# Patient Record
Sex: Female | Born: 1960 | Race: Black or African American | Hispanic: No | Marital: Single | Smoking: Current every day smoker
Health system: Southern US, Community
[De-identification: ages and names within clinical notes are randomized; demographics above are authoritative.]

## PROBLEM LIST (undated history)

## (undated) DIAGNOSIS — Z872 Personal history of diseases of the skin and subcutaneous tissue: Secondary | ICD-10-CM

## (undated) DIAGNOSIS — Q519 Congenital malformation of uterus and cervix, unspecified: Secondary | ICD-10-CM

## (undated) DIAGNOSIS — Z8544 Personal history of malignant neoplasm of other female genital organs: Secondary | ICD-10-CM

## (undated) DIAGNOSIS — Z8711 Personal history of peptic ulcer disease: Secondary | ICD-10-CM

## (undated) DIAGNOSIS — R102 Pelvic and perineal pain: Secondary | ICD-10-CM

## (undated) DIAGNOSIS — Z8541 Personal history of malignant neoplasm of cervix uteri: Secondary | ICD-10-CM

## (undated) DIAGNOSIS — J45909 Unspecified asthma, uncomplicated: Secondary | ICD-10-CM

## (undated) DIAGNOSIS — N950 Postmenopausal bleeding: Secondary | ICD-10-CM

---

## 1998-02-03 ENCOUNTER — Emergency Department (HOSPITAL_COMMUNITY): Admission: EM | Admit: 1998-02-03 | Discharge: 1998-02-03 | Payer: Self-pay | Admitting: Emergency Medicine

## 1998-10-08 ENCOUNTER — Emergency Department (HOSPITAL_COMMUNITY): Admission: EM | Admit: 1998-10-08 | Discharge: 1998-10-08 | Payer: Self-pay | Admitting: Emergency Medicine

## 1998-12-02 ENCOUNTER — Emergency Department (HOSPITAL_COMMUNITY): Admission: EM | Admit: 1998-12-02 | Discharge: 1998-12-02 | Payer: Self-pay | Admitting: Emergency Medicine

## 1998-12-02 ENCOUNTER — Encounter: Payer: Self-pay | Admitting: Emergency Medicine

## 1998-12-04 ENCOUNTER — Emergency Department (HOSPITAL_COMMUNITY): Admission: EM | Admit: 1998-12-04 | Discharge: 1998-12-04 | Payer: Self-pay | Admitting: Emergency Medicine

## 2000-04-10 HISTORY — PX: OTHER SURGICAL HISTORY: SHX169

## 2000-06-12 ENCOUNTER — Emergency Department (HOSPITAL_COMMUNITY): Admission: EM | Admit: 2000-06-12 | Discharge: 2000-06-12 | Payer: Self-pay | Admitting: Emergency Medicine

## 2000-06-12 ENCOUNTER — Encounter: Payer: Self-pay | Admitting: Emergency Medicine

## 2000-06-17 ENCOUNTER — Emergency Department (HOSPITAL_COMMUNITY): Admission: EM | Admit: 2000-06-17 | Discharge: 2000-06-17 | Payer: Self-pay | Admitting: Emergency Medicine

## 2001-08-01 ENCOUNTER — Emergency Department (HOSPITAL_COMMUNITY): Admission: EM | Admit: 2001-08-01 | Discharge: 2001-08-01 | Payer: Self-pay | Admitting: Emergency Medicine

## 2001-08-04 ENCOUNTER — Encounter: Payer: Self-pay | Admitting: Emergency Medicine

## 2001-08-04 ENCOUNTER — Emergency Department (HOSPITAL_COMMUNITY): Admission: EM | Admit: 2001-08-04 | Discharge: 2001-08-04 | Payer: Self-pay | Admitting: Emergency Medicine

## 2002-01-21 ENCOUNTER — Inpatient Hospital Stay (HOSPITAL_COMMUNITY): Admission: AD | Admit: 2002-01-21 | Discharge: 2002-01-21 | Payer: Self-pay | Admitting: Obstetrics and Gynecology

## 2002-01-23 ENCOUNTER — Encounter: Admission: RE | Admit: 2002-01-23 | Discharge: 2002-01-23 | Payer: Self-pay | Admitting: *Deleted

## 2002-01-23 ENCOUNTER — Encounter (INDEPENDENT_AMBULATORY_CARE_PROVIDER_SITE_OTHER): Payer: Self-pay | Admitting: *Deleted

## 2002-01-30 ENCOUNTER — Encounter: Admission: RE | Admit: 2002-01-30 | Discharge: 2002-01-30 | Payer: Self-pay | Admitting: *Deleted

## 2002-01-30 ENCOUNTER — Encounter (INDEPENDENT_AMBULATORY_CARE_PROVIDER_SITE_OTHER): Payer: Self-pay | Admitting: *Deleted

## 2002-01-31 ENCOUNTER — Other Ambulatory Visit: Admission: RE | Admit: 2002-01-31 | Discharge: 2002-01-31 | Payer: Self-pay | Admitting: Obstetrics and Gynecology

## 2002-03-20 ENCOUNTER — Encounter: Admission: RE | Admit: 2002-03-20 | Discharge: 2002-03-20 | Payer: Self-pay | Admitting: Obstetrics and Gynecology

## 2002-03-27 ENCOUNTER — Encounter (INDEPENDENT_AMBULATORY_CARE_PROVIDER_SITE_OTHER): Payer: Self-pay

## 2002-03-27 ENCOUNTER — Ambulatory Visit: Admission: RE | Admit: 2002-03-27 | Discharge: 2002-03-27 | Payer: Self-pay | Admitting: Gynecology

## 2002-04-06 ENCOUNTER — Encounter: Payer: Self-pay | Admitting: Gynecology

## 2002-04-10 ENCOUNTER — Encounter (INDEPENDENT_AMBULATORY_CARE_PROVIDER_SITE_OTHER): Payer: Self-pay | Admitting: Specialist

## 2002-04-10 ENCOUNTER — Ambulatory Visit (HOSPITAL_COMMUNITY): Admission: RE | Admit: 2002-04-10 | Discharge: 2002-04-10 | Payer: Self-pay | Admitting: Gynecology

## 2002-04-24 ENCOUNTER — Ambulatory Visit: Admission: RE | Admit: 2002-04-24 | Discharge: 2002-04-24 | Payer: Self-pay | Admitting: Gynecology

## 2002-05-16 ENCOUNTER — Ambulatory Visit: Admission: RE | Admit: 2002-05-16 | Discharge: 2002-05-16 | Payer: Self-pay | Admitting: Gynecology

## 2002-06-20 ENCOUNTER — Ambulatory Visit: Admission: RE | Admit: 2002-06-20 | Discharge: 2002-06-20 | Payer: Self-pay | Admitting: Gynecology

## 2002-06-26 ENCOUNTER — Inpatient Hospital Stay (HOSPITAL_COMMUNITY): Admission: RE | Admit: 2002-06-26 | Discharge: 2002-06-28 | Payer: Self-pay | Admitting: Obstetrics & Gynecology

## 2002-06-26 ENCOUNTER — Encounter (INDEPENDENT_AMBULATORY_CARE_PROVIDER_SITE_OTHER): Payer: Self-pay

## 2002-06-26 HISTORY — PX: OTHER SURGICAL HISTORY: SHX169

## 2002-06-30 ENCOUNTER — Inpatient Hospital Stay (HOSPITAL_COMMUNITY): Admission: AD | Admit: 2002-06-30 | Discharge: 2002-07-03 | Payer: Self-pay | Admitting: Obstetrics and Gynecology

## 2002-08-15 ENCOUNTER — Encounter (INDEPENDENT_AMBULATORY_CARE_PROVIDER_SITE_OTHER): Payer: Self-pay | Admitting: Specialist

## 2002-08-15 ENCOUNTER — Ambulatory Visit: Admission: RE | Admit: 2002-08-15 | Discharge: 2002-08-15 | Payer: Self-pay | Admitting: Gynecology

## 2002-11-27 ENCOUNTER — Emergency Department (HOSPITAL_COMMUNITY): Admission: EM | Admit: 2002-11-27 | Discharge: 2002-11-27 | Payer: Self-pay | Admitting: Emergency Medicine

## 2003-09-01 ENCOUNTER — Inpatient Hospital Stay (HOSPITAL_COMMUNITY): Admission: AD | Admit: 2003-09-01 | Discharge: 2003-09-01 | Payer: Self-pay | Admitting: *Deleted

## 2004-04-16 ENCOUNTER — Emergency Department (HOSPITAL_COMMUNITY): Admission: EM | Admit: 2004-04-16 | Discharge: 2004-04-17 | Payer: Self-pay | Admitting: Emergency Medicine

## 2004-08-18 ENCOUNTER — Emergency Department (HOSPITAL_COMMUNITY): Admission: EM | Admit: 2004-08-18 | Discharge: 2004-08-18 | Payer: Self-pay | Admitting: Emergency Medicine

## 2005-06-13 ENCOUNTER — Emergency Department (HOSPITAL_COMMUNITY): Admission: EM | Admit: 2005-06-13 | Discharge: 2005-06-13 | Payer: Self-pay | Admitting: Emergency Medicine

## 2005-07-15 ENCOUNTER — Encounter: Payer: Self-pay | Admitting: Vascular Surgery

## 2005-07-15 ENCOUNTER — Ambulatory Visit (HOSPITAL_COMMUNITY): Admission: RE | Admit: 2005-07-15 | Discharge: 2005-07-15 | Payer: Self-pay | Admitting: Orthopedic Surgery

## 2005-08-26 ENCOUNTER — Encounter: Admission: RE | Admit: 2005-08-26 | Discharge: 2005-11-24 | Payer: Self-pay | Admitting: Orthopedic Surgery

## 2006-07-19 ENCOUNTER — Emergency Department (HOSPITAL_COMMUNITY): Admission: EM | Admit: 2006-07-19 | Discharge: 2006-07-19 | Payer: Self-pay | Admitting: *Deleted

## 2010-07-03 NOTE — Consult Note (Signed)
NAME:  [name], [name]                          ACCOUNT NO.:  [id number]   MEDICAL RECORD NO.:  [id number]                   PATIENT TYPE:  OUT   LOCATION:  GYN                                  FACILITY:  [facility]   PHYSICIAN:  De Blanch, M.D.         DATE OF BIRTH:  [birth year]   DATE OF CONSULTATION:  03/27/2002  DATE OF DISCHARGE:                                   CONSULTATION   REASON FOR CONSULTATION:  A 50 year old African-American female seen in  consultation at the request of Dr. Elinor Dodge.  The patient initially saw Dr.  Okey Dupre in December 2003 with lesions on her vulva and a painful lesion on the  left vulva.  Representative biopsies were taken after the patient had been  treated with doxycycline.  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  A Pap smear from the cervix showed low-grade squamous  intraepithelial lesion.   The patient reports she is not certain how long she has had lesions on the  vulva.  She apparently has not had significant amount of medical or  gynecologic care and cannot remember when she had her last Pap smear.   PAST MEDICAL HISTORY/MEDICAL ILLNESSES:  Medical illnesses:  Peptic ulcer  disease (resolved).  The patient does take Zantac and Pepcid AC p.r.n.   SURGICAL HISTORY:  None.   DRUG ALLERGIES:  None.   FAMILY HISTORY:  Negative for gynecologic, breast, or colon cancer.   SOCIAL HISTORY:  The patient is unemployed; she smokes.   OBSTETRICAL HISTORY:  Gravida 0.   GYNECOLOGICAL HISTORY:  The patient has regular menstrual periods monthly.   PHYSICAL EXAMINATION:  GENERAL:  Reveals a well-developed black female in no  acute distress.  HEENT:  Negative.  NECK:  Supple without thyromegaly.  NODES:  There is no supraclavicular or inguinal adenopathy.  ABDOMEN:  Soft, nontender.  No masses, organomegaly, or hernias are noted.  PELVIC:  Examination of the vulva shows multiple raised hypopigmented and  hyperpigmented lesions on the vulva and perianal region.  In addition, there  is a deep ulcerative lesion on the left posterior vulva.  Palpation of this  region does not reveal any fixation.   The vagina is without lesions.  Cervix is nulliparous.  Uterus is anterior,  normal shape, size, and consistency.  The rest of the exam is compromised by  the patient's discomfort.   PROCEDURE NOTE:  Culdoscopic examination of the cervix is performed.  The  patient has a large area of dense, white epithelium covering nearly all of  the cervix.  Representative biopsies are obtained.    IMPRESSION:  1. Carcinoma in situ of the vulva.  I am concerned about the ulcerative     lesion and as to whether that might represent an invasive carcinoma.  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  We  will await biopsy reports.   I think it most imperative that we evaluate the ulcerative lesion and  therefore would recommend the patient undergo examination under anesthesia  and additional biopsies or excision of the deep ulcer on the left posterior  labia majora.  Once these reports are back we can then schedule a more  definitive procedure which would likely require a skinning vulvectomy with  split-thickness skin graft as well as treatment of the cervix with either a  conization, a LEEP procedure, or laser vaporization.  We will take the first  step by scheduling the patient to undergo excisional biopsy of the vulvar  ulcer in two weeks.  In the interim we will obtain a CBC with differential,  OPAA metabolic panel, and test for HIV.  The patient is encouraged to  discontinue smoking.                                               De Blanch, M.D.    DC/MEDQ  D:  03/27/2002  T:  03/27/2002  Job:  [id number]   cc:   Michele Mcalpine D. Okey Dupre, M.D.  [address]  [place]  [place] [address]  Fax: [phone]   Telford Nab, R.N.  [address]  [place], [place] [address]  Fax: [phone]

## 2010-07-03 NOTE — Op Note (Signed)
[name], [name]                          ACCOUNT NO.:  [id number]   MEDICAL RECORD NO.:  [id number]                   PATIENT TYPE:  AMB   LOCATION:  DAY                                  FACILITY:  [facility]   PHYSICIAN:  De Blanch, M.D.         DATE OF BIRTH:  [birth year]   DATE OF PROCEDURE:  04/10/2002  DATE OF DISCHARGE:                                 OPERATIVE REPORT   PREOPERATIVE DIAGNOSIS:  Carcinoma in situ of the vulva, left vulvar ulcer,  carcinoma in situ of the cervix with inadequate colposcopy.   POSTOPERATIVE DIAGNOSIS:  Carcinoma in situ of the vulva, left vulvar ulcer,  carcinoma in situ of the cervix with inadequate colposcopy.   OPERATION PERFORMED:  Examination under anesthesia.  Cold knife conization,  endocervical curettage, partial left vulvectomy.   SURGEON:  De Blanch, M.D.   ASSISTANT:  Telford Nab, R.N.   ANESTHESIA:  General with orotracheal tube.   ESTIMATED BLOOD LOSS:  10 ml.   OPERATIVE FINDINGS:  Examination under anesthesia revealed multiple  hyperpigmented, hypopigmented raised white lesions on the vulva.  There were  also lesions on the perianal region as well.  There was an ulcerative lesion  on the left posterior vulva which was ultimately excised.  The cervix was  enlarged to 4 to 5 cm in diameter and with application of acetic acid had an  extensive area of white epithelium.  The cervical os was stenotic.  The  uterus sounded to approximately 8 cm anteriorly.   INDICATIONS FOR PROCEDURE:  The patient was brought to the operating room  and after satisfactory attainment of general anesthesia, was placed in  lithotomy position in candy cane stirrups.  The perineum and vagina were  prepped with Betadine.  The bladder was emptied with a straight catheter and  the patient was draped.  A weighted speculum was placed in the posterior  vagina.  A Deaver elevated the bladder and the cervix was grasped with a  tenaculum.  Two #0 Vicryl sutures were placed at 3 and 9 o'clock in the  cervix to provide hemostasis and control the cervical branches of the  uterine artery.  The cervix was then injected with 1% Xylocaine with  epinephrine.  A circumferential incision was created around the white  epithelium and a cold knife conization was performed.  The endocervix was  sounded with a sound and then endocervical curettage performed.  The cone  bed was cauterized using electrocautery.   Attention was turned to the vulva.  The vulvar ulcer which had previously  been identified was circumscribed.  As part of the incision, it was clear  that we were cutting across previously documented carcinoma in situ and  therefore surgical margins would not be a serious consideration at this  juncture.   A cautery was used to excise the entire lesion and control subcutaneous  bleeding.  The subcutaneous tissue was then reapproximated with  interrupted  sutures of 2-0 Vicryl and the skin reapproximated with interrupted sutures  of 3-0 Vicryl.   The cervix was then inspected.  A clot was on the cervix indicating the  continued oozing from the cone bed.  A running locking suture was then  placed using #1 Vicryl to achieve further hemostasis of the entire cone bed.  At completion of this, the cone bed was reinspected and seemed to be  hemostatic.   The patient was awakened from anesthesia and taken to the recovery room in  satisfactory condition.  Sponge, needle and instrument counts were correct  times two.   DESCRIPTION OF PROCEDURE:                                                De Blanch, M.D.    DC/MEDQ  D:  04/10/2002  T:  04/10/2002  Job:  [id number]   cc:   Telford Nab, R.N.   Javier Glazier. Okey Dupre, M.D.  [address]  [place]  [place] [address]  Fax: [phone]

## 2010-07-03 NOTE — Consult Note (Signed)
[name], [name]                          ACCOUNT NO.:  [id number]   MEDICAL RECORD NO.:  [id number]                   PATIENT TYPE:   LOCATION:                                       FACILITY:  [facility]   PHYSICIAN:  De Blanch, M.D.         DATE OF BIRTH:  07/20/1960   DATE OF CONSULTATION:  06/20/2002  DATE OF DISCHARGE:                                   CONSULTATION   REASON FOR CONSULTATION:  A 50 year old African-American female seen back in  preparation for surgery which is scheduled for next Tuesday.  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   The patient has no symptoms although she has considerable amount of pruritus  on her right vulva.   HISTORY OF PRESENT ILLNESS:  The patient underwent wide local excision of an  ulcerative vulvar lesion on April 10, 2002 as well as undergoing a cold  knife conization.  The cold knife conization shows severe dysplasia with  negative margins.  The left posterior vulva showed extensive squamous cell  carcinoma in situ associated with an invasive well differentiated squamous  cell carcinoma which extended to within 1 mm of the deep margin.  The  patient had a relatively uncomplicated postoperative course.   PAST SURGICAL HISTORY:  Wide local excision of vulva and cold knife  conization.   PAST MEDICAL HISTORY:  Medical Illnesses:  None.   ALLERGIES:  Drug Allergies:  None.   FAMILY HISTORY:  Negative for gynecologic, breasts, or colon cancers.   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   OBSTETRICAL HISTORY:  Gravida 0.   GYNECOLOGIC HISTORY:  The patient has a history of chronic carcinoma in situ  of the vulva which has really been untreated.   REVIEW OF SYSTEMS:   Reveals no cardiovascular, pulmonary, GI, GU,  musculoskeletal, and neurologic symptoms.   PHYSICAL EXAMINATION:  VITAL SIGNS:  Weight 136 pounds, blood pressure  118/70.  GENERAL:  The patient is a healthy, African-American female in no acute  distress.  HEENT:  Negative.  NECK:  Supple without thyromegaly.  LYMPHATICS:  There is no supraclavicular or inguinal adenopathy.  CHEST:  Clear to percussion and auscultation.  ABDOMEN:  Soft, nontender.  No masses, organomegaly, ascites, or hernias are  noted.  PELVIC:  EGBUS shows that the right vulva is approximately 50% replaced by a  white raised hyperpigmented and white epithelium consistent with carcinoma  in situ.  There is no evidence of ulceration.  A similar appearance is noted  in the left vulva.  The area where a wide local excision was performed is  well healed.  The patient also has carcinoma in situ  in hemorrhoidal tags  and around the anus.  The vagina is normal.  The cervix is healing well from  the conization.  Uterus is anterior and normal in shape, size, and  consistency.  There is no adnexal masses noted.   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   PLAN:  I had a lengthy discussion with the patient and her mother regarding  the management of this lesion.  We would recommend she undergo a modified  radical vulvectomy on the left with a simple vulvectomy on the right in  order to encompass all gross carcinoma in situ.  In addition, we will excise  lesions from her anus.  Primary closure will be planned if the patient does  not wish to have skin grafting.  In addition, we will perform an  inguinofemoral lymphadenectomy.   The risks of surgery including hemorrhage, lymphedema, lymphocyst, and wound  breakdown were discussed.  All of the patient's questions are answered.   We also presented to the patient the opportunity to  participate in the  [facility] protocol being sponsored by the Gynecologic/Oncology group.  She  will see the protocol nurse later today and would plan on signing onto the  protocol.  Surgery is scheduled for Tuesday, May 11th with Javier Glazier. Okey Dupre,  M.D. assisting.                                               De Blanch, M.D.    DC/MEDQ  D:  06/20/2002  T:  06/20/2002  Job:  [id number]   cc:   Javier Glazier. Okey Dupre, M.D.  [address]  [place]  [place] [address]  Fax: [phone]   Telford Nab, R.N.

## 2010-07-03 NOTE — Consult Note (Signed)
   [name], [name]                          ACCOUNT NO.:  [id number]   MEDICAL RECORD NO.:  [id number]                   PATIENT TYPE:  OUT   LOCATION:  GYN                                  FACILITY:  [facility]   PHYSICIAN:  De Blanch, M.D.         DATE OF BIRTH:  06/23/1960   DATE OF CONSULTATION:  04/24/2002  DATE OF DISCHARGE:                                   CONSULTATION   REASON FOR CONSULTATION:  A 50 year old black female who returns having  undergone a partial vulvectomy and conization on 04/10/02.  Final pathology  showed the conization had severe dysplasia with negative margins.  The left  posterior vulvar lesion which was ulcerative and painful preoperatively,  revealed invasive vulvar cancer at least 5 mm in depth and 1 mm from the  deep surgical margin.   The patient has had an uncomplicated postoperative course, has returned to  school full-time.   PHYSICAL EXAMINATION:  VITAL SIGNS:  Weight 138 pounds, blood pressure  118/70.  GENERAL:  The patient is a healthy African-American female in no acute  distress.  ABDOMEN:  Soft, nontender.  There is no inguinal adenopathy.  PELVIC:  EGBUS shows extensive carcinoma in situ of the vulva.  The  vulvectomy site, unfortunately, has had some separation, although it is  granulating well.   IMPRESSION:  1. Invasive vulvar cancer on excisional biopsy.  2. Wound separation.  The patient needs to sitz baths b.i.d. and apply     Silvadene cream after each bath.  She is given a prescription for     Silvadene cream today.   I discussed with her the pathology.  We will allow this area to heal, and  then we will have her back in about four weeks to re-examine her and plan  more definitive surgery which will require lymphadenectomy on the left.  She  would also be a candidate for the [facility] GOG protocol.                                                 De Blanch, M.D.    DC/MEDQ  D:  04/24/2002  T:   04/24/2002  Job:  [id number]   cc:   Michele Mcalpine D. Okey Dupre, M.D.  [address]  [place]  [place] [address]  Fax: [phone]   Telford Nab, R.N.

## 2010-07-03 NOTE — Consult Note (Signed)
NAME:  [name], [name]                          ACCOUNT NO.:  [id number]   MEDICAL RECORD NO.:  [id number]                   PATIENT TYPE:  OUT   LOCATION:  GYN                                  FACILITY:  [facility]   PHYSICIAN:  De Blanch, M.D.         DATE OF BIRTH:  1960/06/23   DATE OF CONSULTATION:  05/16/2002  DATE OF DISCHARGE:                                   CONSULTATION   REASON FOR CONSULTATION:  The patient is a 50 year old African-American  female seen in followup of a newly diagnosed vulvar cancer.  At her last  visit she had some separation of her vulvar wound and has been using sitz  baths and Silvadene cream with good results.  She has minimal pain in this  area at the present time.   She comes accompanied by her mother today to discuss management options and  for a check-up.  She denies any vaginal bleeding or any other significant  new gynecologic symptoms.   PAST SURGICAL HISTORY:  Wide local excision of vulvar mass in 2004.   ALLERGIES:  No known drug allergies.   FAMILY HISTORY:  Negative for gynecologic, breast, or colon cancer.   SOCIAL HISTORY:  The patient is unemployed.  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   OBSTETRICAL HISTORY:  Gravida 0.   GYNECOLOGIC HISTORY:  The patient also has a concurrent history of carcinoma  in situ of the vulva and carcinoma in situ of the cervix.  She underwent  cold knife conization with negative margins.   REVIEW OF SYMPTOMS:  Otherwise negative.   PHYSICAL EXAMINATION:  VITAL SIGNS:  Weigh 138 pounds, blood pressure  118/70.  GENERAL:  The patient is a pleasant healthy young woman in no acute  distress.  HEENT:  Negative.  NECK:  Supple without thyromegaly.  There is  no supraclavicular or inguinal adenopathy.  ABDOMEN:  Soft, nontender, no  masses, ascites, organomegaly, or hernias noted.  PELVIC:  EGBUS shows  extensive carcinoma in situ over both labia majora and  minora bilaterally  across the perineum and involving the anal skin tags and hemorrhoids.  In  addition, there is a cavitated lesion in the lower left vulva which has been  previously excised and found to contain an invasive squamous cell carcinoma  which is well differentiated.  Focally, the carcinoma was less then 1 mm  from the deep margin on this wide excision.   PLAN:  Considering management options, we recommend the patient undergo a  left modified radical vulvectomy with excision of effected skin which  contain carcinoma in situ.  On the right and on the perianal area, wide  excision would be recommended as this appears to be carcinoma in situ,  although I think it should be evaluated more carefully histologically on a  full simple vulvectomy specimen.  We will also excise the perianal disease.  I had a lengthy discussion with the patient and her mother describing the  recommended surgery, and the alteration in anatomic contour of the vulva.  In addition, she needs to have a left inguinal lymphadenectomy.  We have  asked her to consider participation in the [facility] protocol, evaluating tissel  and wound closure of the vulvar wound, and she will  consider this.  She would like to defer surgery until her graduation on  06/22/02, which I think is reasonable given the long duration this lesion has  been present.  We will schedule surgery for 06/26/02.  The risks of surgery  and all questions are answered.                                                De Blanch, M.D.    DC/MEDQ  D:  05/16/2002  T:  05/17/2002  Job:  [id number]   cc:   Michele Mcalpine D. Okey Dupre, M.D.  [address]  [place]  [place] [address]  Fax: [phone]   Telford Nab, R.N.

## 2010-07-03 NOTE — Consult Note (Signed)
NAME:  [name], [name]                          ACCOUNT NO.:  [id number]   MEDICAL RECORD NO.:  [id number]                   PATIENT TYPE:  OUT   LOCATION:  CARD                                 FACILITY:  [facility]   PHYSICIAN:  De Blanch, M.D.         DATE OF BIRTH:  26-Aug-1960   DATE OF CONSULTATION:  07/04/2002  DATE OF DISCHARGE:  07/03/2002                                   CONSULTATION   REASON FOR CONSULTATION:  The patient is a 50 year old African-American  female who is seen in consultation as an inpatient, having been admitted  three days ago with cellulitis of her left lower extremity.  She underwent a  modified radical vulvectomy and left inguinal lymphadenectomy on Jun 26, 2002.  Final pathology showed no evidence of metastatic disease in the  inguinal nodes, and no invasive residual disease, although she had extensive  carcinoma in situ.  She has been treated with ampicillin and gentamycin, and  has noticed a decrease in fever and white count.  Because of the lower  extremity edema and pain, deep vein thrombosis was considered, and the  patient has been treated with Lovenox.  Earlier this morning, however, she  had a duplex Doppler study of her deep venous system that showed no evidence  of deep vein thrombosis.   The patient herself reports that she is feeling somewhat better, although  she has a slightly modest amount of pain in her left upper medial thigh.  She denies any nausea or vomiting.  She has had some difficulties with bowel  movements, but using Xylocaine gel, has had a bowel movement yesterday.   It is also noted that her vulvar wound has broken down somewhat.   PHYSICAL EXAMINATION:  GENERAL:  A well-developed young black female in no  acute distress.  HEENT:  Negative.  NECK:  Supple without thyromegaly.  There is no supraclavicular or inguinal  adenopathy.  The left inguinal incision is healing well.  The Canadian drain  remains in place, and  is draining serous material.  There is some edema  along the inguinal incision, but no breakdown.  The staples remain intact.  EXTREMITIES:  The upper portion of the medial left thigh is erythematous,  edematous, and slightly tender.  She has full range of motion.  PELVIC:  EGBUS shows breakdown of the left radical vulvectomy site and  across the perineum.  The granulation tissue in this area appears healthy.  A few sutures are removed without difficulty.   IMPRESSION:  Left lower extremity cellulitis which seems to be responding to  medical management.  There is no evidence of a lymphocyst.  A deep vein  thrombosis has been ruled out.    PLAN:  We will discontinue the patient's anticoagulant therapy.  I believe  she can be discharged later today, and would suggest she be treated as an  outpatient with Levaquin and Flagyl.  She will  return to the GYN oncology  clinic in one week for re-assessment.                                               De Blanch, M.D.    DC/MEDQ  D:  07/10/2002  T:  07/10/2002  Job:  [id number]   cc:   Michele Mcalpine D. Okey Dupre, M.D.  [address]  [place]  [place] [address]  Fax: [phone]   Telford Nab, R.N.

## 2010-07-03 NOTE — Op Note (Signed)
[name], [name]                          ACCOUNT NO.:  [id number]   MEDICAL RECORD NO.:  [id number]                   PATIENT TYPE:  INP   LOCATION:  0445                                 FACILITY:  [facility]   PHYSICIAN:  De Blanch, M.D.         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   PROCEDURE:  Left inguinal lymphadenectomy, left modified radical vulvectomy,  right simple vulvectomy and excision of perianal lesions.   SURGEON:  De Blanch, M.D.   ASSISTANTMichele Mcalpine D. Okey Dupre, M.D. and Telford Nab, R.N.   ANESTHESIA:  General with oral tracheal tube.   ESTIMATED BLOOD LOSS:  50 mL   FINDINGS:  Examination under anesthesia revealed a vulva which was  extensively involved with thickened skin that was hyperpigmented including  areas over the anus, hemorrhoidal skin tags and right and left vulva. The  left vulva had previously undergone a left wide excision which revealed  invasive carcinoma in the left posterior portion of the vulva. This area had  completely healed from prior surgery. Inguinal lymph nodes were slightly  enlarged but on frozen section were negative.   DESCRIPTION OF PROCEDURE:  The patient was brought to the operating room and  after satisfactory attainment of general anesthesia was placed in the  modified lithotomy position in [place] stirrups. The anterior abdominal wall,  inguinal regions, anterior thighs, vulva and vagina were prepped with  Betadine and the patient was draped.   An incision was made inferior and parallel to the left inguinal ligament.  The dissection carried down to Camper's fascia. The subcutaneous flap was  created initially cephalad. Dissection was then carried down to the anterior  rectus fascia. The lymph nodes overlying the lower anterior rectus fascia  were then dissected free from the fascia down to the inguinal ligament. The  patient was turned laterally where the tensor fascia lata fascia was  identified. Medially we identified the adductor longus muscle. With care to  avoid injury to the saphenous vein, the dissection was carried along the  cribriform fascia excising all the lymph nodes in the superficial  compartments. Care was taken to avoid injury to the femoral artery and vein.  Hemostasis was achieved with cautery and the smaller vessels were ligated  with 2-0 Vicryl. A branch of the saphenous vein was clamped and divided and  suture ligated with 3-0 silk. The subcutaneous region was irrigated and the  lymph nodes were sent for frozen section. A #19 Blake drain was placed  through a stab incision in the left lower quadrant. The subcutaneous tissue  was reapproximated with interrupted 2-0 Vicryl sutures  and the skin closed  with skin staples. The drain was placed to suction.   The patient was repositioned into a lithotomy position and the vulva  exposed. A modified left radical vulvectomy was then performed excising most  of the left vulva extending down to the anus. The deep dissection was to the  pelvic fascia and pubis. Medially the excision extended to the hymeneal  ring.   Two large hemorrhoids which contained carcinoma in situ were excised. Suture  ligatures were required to control bleeding from this area.   Attention was turned to the left vulva where a simple vulvectomy was  performed with a 1 cm margin around all obvious carcinoma in situ. A minimal  amount of subcutaneous tissue was excised along with the stem. Hemostasis  again achieved with cautery and suture ligatures of larger vessels such as  pudendal vessels.   The wound was then irrigated and then closed  with subcutaneous sutures of 2-  0 Vicryl. The skin was reapproximated with a running subcuticular suture of  2-0 Vicryl. Care was taken to avoid any further suture into the anus.   An ice pack was placed on the vulva, patient was awakened from anesthesia  and taken to the recovery room in satisfactory condition. Sponge, needle and  instrument counts were correct x2.                                               De Blanch, M.D.    DC/MEDQ  D:  06/26/2002  T:  06/26/2002  Job:  [id number]   cc:   Michele Mcalpine D. Okey Dupre, M.D.  [address]  [place]  [place] [address]  Fax: [phone]   Telford Nab, R.N.

## 2010-07-03 NOTE — Consult Note (Signed)
   NAME:  [name], [name] NO.:  [id number]   MEDICAL RECORD NO.:  [id number]                    PATIENT TYPE:   LOCATION:                                       FACILITY:   PHYSICIAN:  De Blanch, M.D.         DATE OF BIRTH:   DATE OF CONSULTATION:  07/10/2002  DATE OF DISCHARGE:                                   CONSULTATION   This 50 year old African-American female returns for recheck.  She was seen  last week with cellulitis in her left upper thigh and breakdown of the  posterior aspect of her vulvar incision.  She reports she has done much  better.  She denies any fever or chills.  She is currently taking Levaquin  and Flagyl and tolerating this well.  She notes that the pain in her leg and  the swelling in her leg is much improved.  She is using Sitz baths three  times a day and applying Silvadene cream to the open wound and reports that  this is feeling much better as well.  Overall, her functional status is much  improved over the last week.   PHYSICAL EXAMINATION:  ABDOMEN: Soft, nontender.  No mass, organomegaly,  ascites, or hernia is noted.  Her inguinal incision is healing well.  Staples are removed.  Steri-Strips are applied.  There is some edema in this  area.  EXTREMITIES:  Examination of the left thigh reveals considerable reduction  in her edema, and there is minimal erythema.  The calf and ankle are normal.  PELVIC:  EG/BUS shows complete breakdown of the posterior aspect of the  vulvar wound including the perineum.  This is granulating and is looking  healthy.  The anal region is also healing fine.  The anterior vulvar  incisions are all healing without difficulty.   IMPRESSION:  1. Cellulitis of the left lower extremity thigh with resolving infection.     The patient will continue taking Flagyl and Levaquin to complete a 10-day     course.  2. Breakdown of the posterior vulvar wound.  The patient will continue local   wound care, and we will expect it to heal by secondary intention.  She     will return to see [place] in two to three weeks for continuing evaluation or     as needed.                                               De Blanch, M.D.    DC/MEDQ  D:  07/10/2002  T:  07/10/2002  Job:  [id number]   cc:   Michele Mcalpine D. Okey Dupre, M.D.  [address]  [place]  [place] [address]  Fax: [phone]   Telford Nab, R.N.

## 2010-07-03 NOTE — H&P (Signed)
[name], [name]                          ACCOUNT NO.:  [id number]   MEDICAL RECORD NO.:  [id number]                   PATIENT TYPE:  INP   LOCATION:  9307                                 FACILITY:  [facility]   PHYSICIAN:  Phil D. Okey Dupre, M.D.                  DATE OF BIRTH:  [birth year]   DATE OF ADMISSION:  06/30/2002  DATE OF DISCHARGE:                                HISTORY & PHYSICAL   CHIEF COMPLAINT:  The patient came in complaining of severe left thigh pain  as well as pain near a surgical incision scar in the left inguinal area with  numbness in the left thigh and a terrible smell.   HISTORY OF PRESENT ILLNESS:  The patient is a 50 year old black female four  days postoperative at [facility] where she underwent left inguinal  lymphadenectomy with modified radical left vulvectomy and right simple  vulvectomy that extended around the perianal area.  The surgery was done for  squamous cell carcinoma of the vulva.  She was discharged from [facility]  two days ago in good condition, comfortable, and afebrile, and during the  night last night and the day prior to admission she began feeling warm and  general malaise and severe pain in the area of her left inguinal incision  extending down into the left thigh.  She also complained of a very foul odor  coming from her perineum.   MEDICATIONS:  The medications that the patient was on was only Percocet for  pain and Peri-Colace.   ALLERGIES:  There are no known allergies.   PAST MEDICAL HISTORY:  Noncontributory just intermittent interval  hospitalization.   FAMILY HISTORY:  Noncontributory.   PHYSICAL EXAMINATION:  VITAL SIGNS:  On admission, the patient was 5 feet 1  inch, weighed 136 pounds.  Her temperature was 98.4, pulse of 80,  respirations 20 per minute, and a blood pressure 126/82.  Pain description  was a 10.  GENERAL APPEARANCE:  A well-developed female in obvious distress and  tearful, complaining of pain  as stated in the Present Illness.  HEENT:  Normal.  LUNGS:  Clear to auscultation and percussion.  NECK:  Supple with no masses.  HEART:  No murmur.  Normal sinus rhythm.  BREASTS:  Symmetrical with no dominant masses or __________.  ABDOMEN:  Soft, flat, nontender with good active bowel sounds except for the  area in the left inguinal region just below the diagonal incision in that  area where the lymphadenectomy had been carried out.  There was marked  erythema and induration without fluctuation.  This extended down into the  left thigh from three quarters across the anterior aspect from medial to  lateral and extending around the medial portion of the thigh stopping just  approximately 3 cm from going to the posterior aspect of the thigh.  GENITOURINARY:  The area of surgery  in the perineum appeared to be healed  normally with exception of an excoriation with a foul exudate and  significant breakdown of the incision around the anal area.  The  extremities, other than the aforementioned, were normal.  Calves were normal  and no other genitalia exam was done because of the patient's pain.   IMPRESSION:  Cellulitis of the left thigh interior extending to the lower  portion of the surgical incision with a possibility of thrombophlebitis and  a breakdown of the perianal surgical site.   PLAN:  Admission, antibiotic treatment with triple therapy, anticoagulation  with heparin, and irrigation and sitz baths for the perianal area, and pain  control.  __________of other significance is there is still a significant  drainage from the [place] drain area in the area of the inguinal  lymphadenectomy.                                               Phil D. Okey Dupre, M.D.    PDR/MEDQ  D:  06/30/2002  T:  06/30/2002  Job:  [id number]

## 2010-07-03 NOTE — Discharge Summary (Signed)
NAMEJANELLI, [name]                          ACCOUNT NO.:  [id number]   MEDICAL RECORD NO.:  [id number]                   PATIENT TYPE:  INP   LOCATION:  9306                                 FACILITY:  [facility]   PHYSICIAN:  Phil D. Okey Dupre, M.D.                  DATE OF BIRTH:  1960-04-22   DATE OF ADMISSION:  06/30/2002  DATE OF DISCHARGE:  07/03/2002                                 DISCHARGE SUMMARY   HOSPITAL COURSE:  The patient is a 50 year old black female who was seen in  the MAU four days postoperatively after left inguinal lymphadenectomy and  modified radical left vulvectomy and simple right vulvectomy.  She was two  days after discharge from the hospital without complications.  She arrived  having severe left groin pain which radiated into the left thigh accompanied  by numbness in the left thigh and a severe odor.  She was noted to have a  large cellulitis in the lower part of the inguinal incision that extended to  the left thigh and was admitted and treated as a cellulitis with rule out of  possible thrombophlebitis.  She seemed to improve over the next day and the  evening of Jul 02, 2002, not having had a bowel movement since the time of  surgery and being treated with sitz baths and antibiotics, she was given  Peri-Colace.  By the evening of May 17 she was having a very difficult time  because of symptoms that mimicked a rectal impaction.  We examined the  patient, was able to give her two Fleets enemas, and it helped evacuate some  of the rectum.  Following that she seemed to be much better.  She was also  put on Lovenox during her stay as a precaution in case she had  thrombophlebitis.  On the morning of May 18 she was transported to [facility] for Doppler of the left leg to rule out deep vein thrombosis, which  turned out to be negative and on her return was discharged to home to be  followed up the following day with Dr. Stanford Breed.  Over the hospital  course  for which she had been afebrile up until her day of admission, she  finally had a fever of 100.7 on the evening of her second admission here.  Had one other elevation during her stay.  Otherwise, she was afebrile and  she was discharged on Colace, Milk of Magnesia, Flagyl by mouth, and  Levaquin.  She was asked not to strain to avoid pressure on her perineum.  She was given sitz baths four times a day at home.   DISCHARGE DIAGNOSIS:  Postoperative cellulitis of the left thigh.  Phil D. Okey Dupre, M.D.    PDR/MEDQ  D:  08/09/2002  T:  08/11/2002  Job:  [id number]

## 2010-07-03 NOTE — Discharge Summary (Signed)
   [name], [name]                          ACCOUNT NO.:  [id number]   MEDICAL RECORD NO.:  [id number]                   PATIENT TYPE:  INP   LOCATION:  0445                                 FACILITY:  [facility]   PHYSICIAN:  Phil D. Okey Dupre, M.D.                  DATE OF BIRTH:  September 07, 1960   DATE OF ADMISSION:  06/26/2002  DATE OF DISCHARGE:  06/28/2002                                 DISCHARGE SUMMARY   HISTORY OF PRESENT ILLNESS:  The patient is a 50 year old black female with  stage II vulvular cancer who was admitted for definitive surgery.   HOSPITAL COURSE:  Surgery was carried out by Dr. Stanford Breed with the  assistance of Dr. Elinor Dodge and Telford Nab, R.N. on the day of  admission.  The patient underwent inguinal lymphadenectomy with a left  modified radical vulvectomy and a right simple vulvectomy.  This was carried  down around the perianal area.  At the time of discharge, final pathology  was not yet available.  The patient had been afebrile postoperatively, and  was doing very well.  The abdominal wound in the place of left inguinal  lymphadenectomy had a drain which was left in place at the time of  discharge.  This was draining copious amounts of straw colored lymphatic  fluid, and will be left.  The perineal wound was clean and appeared to be in  good condition.  The patient was given instructions as to shower, and use  the Peri-Bottle and a hair dryer to wash off.  She is also encouraged to get  a sponge rubber donut to make sitting more comfortable for her.  She was  given a sitz bath to take home just in case she needed that in the case of a  suture breakdown, this did not seem likely at this present time.  She was  placed on Peri-Colace b.i.d. until she had a bowel movement, and then to go  on Colace b.i.d. and to increase her fluid intake.  She was placed on  Percocet for pain.  She was instructed to the MAU and see Dr. Okey Dupre on July 31, 2002, and will be  followed up on August 03, 2002, at [facility]  to see Dr. Stanford Breed.   CONDITION ON DISCHARGE:  Satisfactory, status post left inguinal  lymphadenectomy and left modified radical vulvectomy with right simple  vulvectomy.                                               Phil D. Okey Dupre, M.D.    PDR/MEDQ  D:  06/29/2002  T:  06/29/2002  Job:  [id number]

## 2010-07-03 NOTE — Consult Note (Signed)
NAME:  [name], [name]                          ACCOUNT NO.:  [id number]   MEDICAL RECORD NO.:  [id number]                   PATIENT TYPE:  OUT   LOCATION:  GYN                                  FACILITY:  [facility]   PHYSICIAN:  De Blanch, M.D.         DATE OF BIRTH:  [birth year]   DATE OF CONSULTATION:  08/15/2002  DATE OF DISCHARGE:                                   CONSULTATION   HISTORY OF PRESENT ILLNESS:  A 50 year old female returns for continuing  follow-up of a stage II vulvar carcinoma and extensive vulvar carcinoma in  situ.  She underwent a modified radical vulvectomy, left inguinal  lymphadenectomy, and partial simple vulvectomy on Jun 26, 2002.  All lymph  nodes were negative and the remainder of the vulvar specimen showed no  evidence of residual invasive carcinoma although she did have extensive  squamous cell carcinoma in situ with focal involvement of margin.  No  further therapy was recommended.  The patient had a relatively uncomplicated  course although she did require readmission for wound cellulitis which  resolved quickly with antibiotics.  The patient today has no symptoms.  She  reports her leg feels fine as does the inguinal region except for some  numbness in her anterior thigh.  She has no new vulvar symptoms or no new  lesions that she is recognizing.  She denies any vaginal bleeding or  discharge.   REVIEW OF SYSTEMS:  Is essentially negative.   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   PHYSICAL EXAMINATION:  VITAL SIGNS:  Weight 134 pounds, blood pressure  112/74.  HEENT:  Negative.  NECK:  Supple without thyromegaly.  NODES:  There is no supraclavicular or inguinal adenopathy.  ABDOMEN:  Soft, nontender.  No mass, organomegaly, ascites, or hernias are  noted.  The inguinal  region specifically has healed well on the left.  No  lymphocysts are noted.  There is no edema and no palpable adenopathy.  PELVIC:  EG/BUS is status post left radical vulvectomy.  The area that had  broken down is completely healed.  She does have approximately 1 cm lesion  in the left anterior labia majora which is very suspicious for carcinoma in  situ.  The remainder of the vulva looks normal.  The vagina also looks  normal.   PROCEDURE NOTE:  After preparation with Betadine the subcutaneous tissue was  infiltrated with 1% Xylocaine.  A wide local excision was then accomplished.  The subcutaneous tissue is reapproximated with interrupted 3-0 chromic  suture and two cutaneous sutures are taken to reapproximate the skin edges.  Hemostasis was excellent.   IMPRESSION:  History of stage II vulvar cancer and extensive carcinoma in  situ, doing quite well although  she appears to have a small local recurrence  which we have widely excised today.  This will be submitted to pathology.  The patient will return to see [place] in three months for continuing follow-up,  or as needed if she has difficulty with her wound or has new symptoms.                                               De Blanch, M.D.    DC/MEDQ  D:  08/15/2002  T:  08/15/2002  Job:  [id number]

## 2013-10-01 ENCOUNTER — Emergency Department (HOSPITAL_COMMUNITY): Payer: Self-pay

## 2013-10-01 ENCOUNTER — Emergency Department (HOSPITAL_COMMUNITY)
Admission: EM | Admit: 2013-10-01 | Discharge: 2013-10-01 | Disposition: A | Discharge status: Home / Self Care | Payer: Self-pay | Attending: Emergency Medicine | Admitting: Emergency Medicine

## 2013-10-01 DIAGNOSIS — M79605 Pain in left leg: Secondary | ICD-10-CM

## 2013-10-01 DIAGNOSIS — S82142A Displaced bicondylar fracture of left tibia, initial encounter for closed fracture: Secondary | ICD-10-CM

## 2013-10-01 DIAGNOSIS — M79609 Pain in unspecified limb: Secondary | ICD-10-CM | POA: Insufficient documentation

## 2013-10-01 DIAGNOSIS — IMO0001 Reserved for inherently not codable concepts without codable children: Secondary | ICD-10-CM | POA: Insufficient documentation

## 2013-10-01 MED ORDER — OXYCODONE-ACETAMINOPHEN 5-325 MG PO TABS: 1.0000 | ORAL_TABLET | Freq: Four times a day (QID) | ORAL | Status: DC | PRN

## 2013-10-01 MED ORDER — MORPHINE SULFATE 4 MG/ML IJ SOLN: 8.0000 mg | Freq: Once | INTRAMUSCULAR | Status: DC

## 2013-10-01 MED ORDER — MORPHINE SULFATE 4 MG/ML IJ SOLN
4.0000 mg | Freq: Once | INTRAMUSCULAR | Status: AC
  Administered 2013-10-01: 4 mg via INTRAVENOUS
  Filled 2013-10-01: qty 1

## 2013-10-01 NOTE — ED Provider Notes (Signed)
CSN: [id number]     Arrival date & time 10/01/13  1040 History   First MD Initiated Contact with Patient 10/01/13 1131     Chief Complaint  Patient presents with  . Leg Pain  . fx tibia      (Consider location/radiation/quality/duration/timing/severity/associated sxs/prior Treatment) HPI Comments: [name] is a 53 y.o. Female with no significant PMHx, who had a fall from a porch last week onto her L knee and caused a tibial plateau fx. Was seen in [place] and told to f/up there with the orthopedist. She presents today with complaints of achy burning pain in her L hip and anterior upper thigh as well as the pain in her L knee which was present at time of injury, as well as intermittent tingling to her L foot. States she is staying with family here and needs ortho f/up here instead. Was supposed to see ortho in [place] tomorrow. States her leg pain is currently 10/10, constant, nonradiating, with no known aggravating or alleviating factors. Takes oxycodone 5mg  at home for pain which helps. States the tingling in her foot is intermittent, and she has not noticed any color changes or swelling. States the knee has become less swollen over the last 6 days. Denies fevers, CP, SOB, abd pain, N/V/D, diaphoresis, loss of mobility in feet, pallor, or rash.   Patient is a 53 y.o. female presenting with leg pain. The history is provided by the patient. No language interpreter was used.  Leg Pain Location:  Leg Time since incident:  6 days Injury: yes   Mechanism of injury: fall   Fall:    Fall occurred: fall off porch.   Height of fall:  Unknown   Point of impact:  Knees   Entrapped after fall: no   Leg location:  L leg (L knee) Pain details:    Quality:  Aching and burning   Radiates to:  Does not radiate   Severity:  Moderate   Onset quality:  Gradual   Duration:  6 days   Timing:  Constant   Progression:  Unchanged Chronicity:  New Dislocation: no   Foreign body present:  No foreign  bodies Tetanus status:  Up to date Prior injury to area:  Yes Relieved by:  Rest, ice and immobilization Worsened by:  Nothing tried Ineffective treatments:  None tried Associated symptoms: decreased ROM, stiffness, swelling (improved) and tingling (intermittent)   Associated symptoms: no back pain, no fever, no itching, no muscle weakness, no neck pain and no numbness     No past medical history on file. No past surgical history on file. No family history on file. History  Substance Use Topics  . Smoking status: Not on file  . Smokeless tobacco: Not on file  . Alcohol Use: Not on file   OB History   No data available     Review of Systems  Constitutional: Negative for fever.  Respiratory: Negative for shortness of breath.   Cardiovascular: Negative for chest pain and leg swelling.  Gastrointestinal: Negative for nausea, vomiting and abdominal pain.  Musculoskeletal: Positive for arthralgias (L knee), gait problem (nonwt bearing), joint swelling (L knee), myalgias (L thigh) and stiffness. Negative for back pain, neck pain and neck stiffness.  Skin: Negative for color change and itching.  Neurological: Negative for dizziness, weakness, numbness and headaches.  10 Systems reviewed and are negative for acute change except as noted in the HPI.     Allergies  Review of patient's allergies indicates  no known allergies.  Home Medications   Prior to Admission medications   Medication Sig Start Date End Date Taking? Authorizing Provider  albuterol (PROVENTIL HFA;VENTOLIN HFA) 108 (90 BASE) MCG/ACT inhaler Inhale 2 puffs into the lungs every 6 (six) hours as needed for wheezing or shortness of breath.   Yes Historical Provider, MD  oxyCODONE (OXY IR/ROXICODONE) 5 MG immediate release tablet Take 5 mg by mouth every 4 (four) hours as needed for severe pain.   Yes Historical Provider, MD   BP 130/82  Pulse 76  Temp(Src) 99.1 F (37.3 C) (Oral)  Resp 12  Wt 165 lb (74.844 kg)   SpO2 100% Physical Exam  Nursing note and vitals reviewed. Constitutional: She is oriented to person, place, and time. Vital signs are normal. She appears well-developed and well-nourished. No distress.  VSS, NAD  HENT:  Head: Normocephalic and atraumatic.  Mouth/Throat: Mucous membranes are normal.  Eyes: Conjunctivae and EOM are normal. Right eye exhibits no discharge. Left eye exhibits no discharge.  Neck: Normal range of motion. Neck supple.  Cardiovascular: Normal rate and intact distal pulses.   Pulses:      Popliteal pulses are 2+ on the right side. Left popliteal pulse not accessible.       Dorsalis pedis pulses are 2+ on the right side, and 2+ on the left side.       Posterior tibial pulses are 2+ on the right side, and 2+ on the left side.  DP/PT pulses palpated and confirmed with doppler, marked  Pulmonary/Chest: Effort normal. No respiratory distress.  Abdominal: Normal appearance. She exhibits no distension.  Musculoskeletal:       Left hip: She exhibits decreased range of motion and tenderness. She exhibits no swelling, no crepitus and no deformity.       Left knee: She exhibits decreased range of motion, swelling, effusion and bony tenderness. She exhibits no deformity and no erythema.  L knee with immobilizer placed, dec ROM and swelling with knee effusion, lateral tibia bony TTP, no erythema or warmth. L ankle nonTTP with FROM intact, wiggles toes well, sensation grossly intact over all extremities, strength with dorsiflexion/plantarflexion 5/5. Pulses intact bilaterally. No skin color changes, b/l extremities warm to touch. L hip mildly TTP along greater trochanter and into quads muscles.  Neurological: She is alert and oriented to person, place, and time. She has normal strength. No sensory deficit.  Sensation grossly intact in all extremities. Strength with dorsiflexion/plantarflexion 5/5 bilaterally  Skin: Skin is warm, dry and intact. No rash noted. No erythema. No  pallor.  Warm, dry, intact, no erythema or color changes  Psychiatric: She has a normal mood and affect.    ED Course  Procedures (including critical care time) Labs Review Labs Reviewed - No data to display  Imaging Review Dg Knee Complete 4 Views Left  10/01/2013   CLINICAL DATA:  Pain post trauma  EXAM: LEFT KNEE - COMPLETE 4+ VIEW  COMPARISON:  None.  FINDINGS: Frontal, lateral, and bilateral oblique views were obtained. There is a comminuted tibial plateau fracture without significant depression. There is a joint effusion. No dislocation. Joint spaces appear intact.  IMPRESSION: Lateral tibial plateau fracture.  Joint effusion.   Electronically Signed   By: Lowella Grip M.D.   On: 10/01/2013 12:24     EKG Interpretation None      MDM   Final diagnoses:  Tibial plateau fracture, left, closed, initial encounter  Leg pain, anterior, left    53y/o female  with tibial plateau fx, nondisplaced. Needs to f/up with ortho, given number for [facility] on call. Discussed continued use of immobilizer, ice and elevation, and home pain meds. Can use additional ibuprofen or tylenol. Discussed crutches and nonweight bearing. Pulses equal bilaterally, no skin changes, no concern for damage to neurvascular function to LLE. I explained the diagnosis and have given explicit precautions to return to the ER including for any other new or worsening symptoms. The patient understands and accepts the medical plan as it's been dictated and I have answered their questions. Discharge instructions concerning home care and prescriptions have been given. The patient is STABLE and is discharged to home in good condition.  BP 111/86  Pulse 88  Temp(Src) 99.1 F (37.3 C) (Oral)  Resp 19  Wt 165 lb (74.844 kg)  SpO2 100%  Meds ordered this encounter  Medications  . morphine 4 MG/ML injection 4 mg    Sig:        Patty Sermons Camprubi-Soms, PA-C 10/01/13 1311

## 2013-10-01 NOTE — ED Notes (Signed)
To ED via private vehicle with c/o left leg pain, from tibial plateau fx last Tuesday-- was seen at [facility] because pt lives in [place]-- is staying in [place] with family at present. States left foot feels numb and having severe pain in leg. Requesting referal to orthopedist in [place] also.

## 2013-10-01 NOTE — ED Provider Notes (Signed)
Medical screening examination/treatment/procedure(s) were performed by non-physician practitioner and as supervising physician I was immediately available for consultation/collaboration.   EKG Interpretation None      Rolland Porter, MD, Abram Sander   Janice Norrie, MD 10/01/13 [phone]

## 2013-10-01 NOTE — ED Notes (Signed)
ASSISTED PATIENT FROM WHEELCHAIR TO BED. PATIENT C/O LEFT KNEE PAIN ,SWEELING. PLACED PILLOW UNDER PATIENTS LEG ,AND APPLIED  ICE PACK FOR PAIN AND SWELLING.

## 2013-10-01 NOTE — Discharge Instructions (Signed)
Wear knee immobilizer until you're see by the orthopedist. Use crutches for all weight bearing, do not use your left leg to walk. Ice and elevate knee throughout the day. Alternate between ibuprofen and your home pain medication for pain relief. May use additional tylenol if needed. Do not drive or operate machinery with pain medication use. Call orthopedic follow up today to schedule followup appointment for this week. Return to the ER for any changes or worsening symptoms.   Knee Fracture, Adult A knee fracture is a break in any of the bones of the lower part of the thigh bone, the upper part of the bones of the lower leg, or of the kneecap. When the bones no longer meet the way they are supposed to it is called a dislocation. Sometimes there can be a dislocation along with fractures. SYMPTOMS  Symptoms may include pain, swelling, inability to bend the knee, deformity of the knee, and inability to walk.  DIAGNOSIS  This problem is usually diagnosed with x-rays. Special studies are sometimes done if a fracture is suspected but cannot be seen on ordinary x-rays. If vessels around the knee are injured, special tests may be done to see what the damage is. TREATMENT   The leg is usually splinted for the first couple of days to allow for swelling. After the swelling is down a cast is put on. Sometimes a cast is put on right away with the sides of the cast cut to allow the knee to swell. If the bones are in place, this may be all that is needed.  If the bones are out of place, medications for pain are given to allow them to be put back in place. If they are seriously out of place, surgery may be needed to hold the pieces or breaks in place using wires, pins, screws or metal plates.  Generally most fractures will heal in 4 to 6 weeks. HOME CARE INSTRUCTIONS   Use your crutches as directed.  To lessen the swelling, keep the injured leg elevated while sitting or lying down.  Apply ice to the injury for  15-20 minutes, 03-04 times per day while awake for 2 days. Put the ice in a plastic bag and place a thin towel between the bag of ice and your cast.  If you have a plaster or fiberglass cast:  Do not try to scratch the skin under the cast using sharp or pointed objects.  Check the skin around the cast every day. You may put lotion on any red or sore areas.  Keep your cast dry and clean.  If you have a plaster splint:  Wear the splint as directed.  You may loosen the elastic around the splint if your toes become numb, tingle, or turn cold or blue.  Do not put pressure on any part of your cast or splint; it may break. Rest your cast only on a pillow the first 24 hours until it is fully hardened.  Your cast or splint can be protected during bathing with a plastic bag. Do not lower the cast or splint into water.  Only take over-the-counter or prescription medicines for pain, discomfort, or fever as directed by your caregiver.  See your caregiver soon if your cast gets damaged or breaks.  It is very important to keep all follow up appointments. Not following up as directed may result in a worsening of your condition or a failure of the fracture to heal properly. SEEK IMMEDIATE MEDICAL CARE IF:  You have continued severe pain.  You have more swelling than you did before the cast was put on.  The area below the fracture becomes painful.  Your skin or toenails below the injury turn blue or gray, or feel cold or numb.  There is drainage coming from under the cast.  New, unexplained symptoms develop (drugs used in treatment may produce side effects). MAKE SURE YOU:   Understand these instructions.  Will watch your condition.  Will get help right away if you are not doing well or get worse. Document Released: 12/15/2005 Document Revised: 04/26/2011 Document Reviewed: 01/16/2007 [facility] Patient Information 2015 [place], [place]. This information is not intended to replace advice  given to you by your health care provider. Make sure you discuss any questions you have with your health care provider.  Cryotherapy Cryotherapy is when you put ice on your injury. Ice helps lessen pain and puffiness (swelling) after an injury. Ice works the best when you start using it in the first 24 to 48 hours after an injury. HOME CARE  Put a dry or damp towel between the ice pack and your skin.  You may press gently on the ice pack.  Leave the ice on for no more than 10 to 20 minutes at a time.  Check your skin after 5 minutes to make sure your skin is okay.  Rest at least 20 minutes between ice pack uses.  Stop using ice when your skin loses feeling (numbness).  Do not use ice on someone who cannot tell you when it hurts. This includes small children and people with memory problems (dementia). GET HELP RIGHT AWAY IF:  You have white spots on your skin.  Your skin turns blue or pale.  Your skin feels waxy or hard.  Your puffiness gets worse. MAKE SURE YOU:   Understand these instructions.  Will watch your condition.  Will get help right away if you are not doing well or get worse. Document Released: 07/21/2007 Document Revised: 04/26/2011 Document Reviewed: 09/24/2010 [facility] Patient Information 2015 [place], [place]. This information is not intended to replace advice given to you by your health care provider. Make sure you discuss any questions you have with your health care provider.  Cast or Splint Care Casts and splints support injured limbs and keep bones from moving while they heal.  HOME CARE  Keep the cast or splint uncovered during the drying period.  A plaster cast can take 24 to 48 hours to dry.  A fiberglass cast will dry in less than 1 hour.  Do not rest the cast on anything harder than a pillow for 24 hours.  Do not put weight on your injured limb. Do not put pressure on the cast. Wait for your doctor's approval.  Keep the cast or splint  dry.  Cover the cast or splint with a plastic bag during baths or wet weather.  If you have a cast over your chest and belly (trunk), take sponge baths until the cast is taken off.  If your cast gets wet, dry it with a towel or blow dryer. Use the cool setting on the blow dryer.  Keep your cast or splint clean. Wash a dirty cast with a damp cloth.  Do not put any objects under your cast or splint.  Do not scratch the skin under the cast with an object. If itching is a problem, use a blow dryer on a cool setting over the itchy area.  Do not trim or  cut your cast.  Do not take out the padding from inside your cast.  Exercise your joints near the cast as told by your doctor.  Raise (elevate) your injured limb on 1 or 2 pillows for the first 1 to 3 days. GET HELP IF:  Your cast or splint cracks.  Your cast or splint is too tight or too loose.  You itch badly under the cast.  Your cast gets wet or has a soft spot.  You have a bad smell coming from the cast.  You get an object stuck under the cast.  Your skin around the cast becomes red or sore.  You have new or more pain after the cast is put on. GET HELP RIGHT AWAY IF:  You have fluid leaking through the cast.  You cannot move your fingers or toes.  Your fingers or toes turn blue or white or are cool, painful, or puffy (swollen).  You have tingling or lose feeling (numbness) around the injured area.  You have bad pain or pressure under the cast.  You have trouble breathing or have shortness of breath.  You have chest pain. Document Released: 06/03/2010 Document Revised: 10/04/2012 Document Reviewed: 08/10/2012 [facility] Patient Information 2015 [place], [place]. This information is not intended to replace advice given to you by your health care provider. Make sure you discuss any questions you have with your health care provider.

## 2014-02-19 ENCOUNTER — Emergency Department (HOSPITAL_COMMUNITY)
Admission: EM | Admit: 2014-02-19 | Discharge: 2014-02-19 | Disposition: A | Discharge status: Home / Self Care | Payer: Self-pay | Attending: Emergency Medicine | Admitting: Emergency Medicine

## 2014-02-19 ENCOUNTER — Encounter (HOSPITAL_COMMUNITY): Payer: Self-pay | Admitting: Emergency Medicine

## 2014-02-19 ENCOUNTER — Emergency Department (HOSPITAL_COMMUNITY): Payer: Self-pay

## 2014-02-19 DIAGNOSIS — R05 Cough: Secondary | ICD-10-CM

## 2014-02-19 DIAGNOSIS — R059 Cough, unspecified: Secondary | ICD-10-CM

## 2014-02-19 DIAGNOSIS — J45909 Unspecified asthma, uncomplicated: Secondary | ICD-10-CM | POA: Insufficient documentation

## 2014-02-19 DIAGNOSIS — Z720 Tobacco use: Secondary | ICD-10-CM | POA: Insufficient documentation

## 2014-02-19 DIAGNOSIS — R112 Nausea with vomiting, unspecified: Secondary | ICD-10-CM | POA: Insufficient documentation

## 2014-02-19 DIAGNOSIS — Z79899 Other long term (current) drug therapy: Secondary | ICD-10-CM | POA: Insufficient documentation

## 2014-02-19 DIAGNOSIS — R197 Diarrhea, unspecified: Secondary | ICD-10-CM | POA: Insufficient documentation

## 2014-02-19 HISTORY — DX: Unspecified asthma, uncomplicated: J45.909

## 2014-02-19 LAB — URINALYSIS, ROUTINE W REFLEX MICROSCOPIC
Bilirubin Urine: NEGATIVE
GLUCOSE, UA: NEGATIVE mg/dL
Hgb urine dipstick: NEGATIVE
KETONES UR: NEGATIVE mg/dL
LEUKOCYTES UA: NEGATIVE
NITRITE: NEGATIVE
PROTEIN: NEGATIVE mg/dL
Specific Gravity, Urine: 1.02 (ref 1.005–1.030)
Urobilinogen, UA: 0.2 mg/dL (ref 0.0–1.0)
pH: 5 (ref 5.0–8.0)

## 2014-02-19 LAB — CBC WITH DIFFERENTIAL/PLATELET
BASOS PCT: 0 % (ref 0–1)
Basophils Absolute: 0 10*3/uL (ref 0.0–0.1)
EOS ABS: 0.1 10*3/uL (ref 0.0–0.7)
EOS PCT: 2 % (ref 0–5)
HCT: 44 % (ref 36.0–46.0)
Hemoglobin: 14.4 g/dL (ref 12.0–15.0)
Lymphocytes Relative: 39 % (ref 12–46)
Lymphs Abs: 2.4 10*3/uL (ref 0.7–4.0)
MCH: 31 pg (ref 26.0–34.0)
MCHC: 32.7 g/dL (ref 30.0–36.0)
MCV: 94.6 fL (ref 78.0–100.0)
MONO ABS: 0.7 10*3/uL (ref 0.1–1.0)
MONOS PCT: 10 % (ref 3–12)
Neutro Abs: 3.1 10*3/uL (ref 1.7–7.7)
Neutrophils Relative %: 49 % (ref 43–77)
PLATELETS: 419 10*3/uL — AB (ref 150–400)
RBC: 4.65 MIL/uL (ref 3.87–5.11)
RDW: 13.6 % (ref 11.5–15.5)
WBC: 6.2 10*3/uL (ref 4.0–10.5)

## 2014-02-19 LAB — COMPREHENSIVE METABOLIC PANEL
ALK PHOS: 105 U/L (ref 39–117)
ALT: 20 U/L (ref 0–35)
AST: 21 U/L (ref 0–37)
Albumin: 3.6 g/dL (ref 3.5–5.2)
Anion gap: 7 (ref 5–15)
BUN: <5 mg/dL — ABNORMAL LOW (ref 6–23)
CALCIUM: 9.6 mg/dL (ref 8.4–10.5)
CO2: 27 mmol/L (ref 19–32)
CREATININE: 0.62 mg/dL (ref 0.50–1.10)
Chloride: 106 mEq/L (ref 96–112)
GFR calc non Af Amer: >90 mL/min (ref >90)
GLUCOSE: 109 mg/dL — AB (ref 70–99)
Potassium: 4 mmol/L (ref 3.5–5.1)
Sodium: 140 mmol/L (ref 135–145)
TOTAL PROTEIN: 7.9 g/dL (ref 6.0–8.3)
Total Bilirubin: 0.3 mg/dL (ref 0.3–1.2)

## 2014-02-19 MED ORDER — ONDANSETRON HCL 4 MG/2ML IJ SOLN
4.0000 mg | Freq: Once | INTRAMUSCULAR | Status: AC
  Administered 2014-02-19: 4 mg via INTRAVENOUS
  Filled 2014-02-19: qty 2

## 2014-02-19 MED ORDER — ONDANSETRON 4 MG PO TBDP: 4.0000 mg | ORAL_TABLET | Freq: Three times a day (TID) | ORAL | Status: DC | PRN

## 2014-02-19 MED ORDER — SODIUM CHLORIDE 0.9 % IV BOLUS (SEPSIS)
1000.0000 mL | Freq: Once | INTRAVENOUS | Status: AC
  Administered 2014-02-19: 1000 mL via INTRAVENOUS

## 2014-02-19 MED ORDER — HYDROCOD POLST-CHLORPHEN POLST 10-8 MG/5ML PO LQCR: 5.0000 mL | Freq: Two times a day (BID) | ORAL | Status: DC | PRN

## 2014-02-19 MED ORDER — HYDROCOD POLST-CHLORPHEN POLST 10-8 MG/5ML PO LQCR
5.0000 mL | Freq: Once | ORAL | Status: AC
  Administered 2014-02-19: 5 mL via ORAL
  Filled 2014-02-19: qty 5

## 2014-02-19 NOTE — ED Provider Notes (Signed)
CSN: [id number]     Arrival date & time 02/19/14  1459 History   First MD Initiated Contact with Patient 02/19/14 1817     Chief Complaint  Patient presents with  . Cough  . Diarrhea     (Consider location/radiation/quality/duration/timing/severity/associated sxs/prior Treatment) The history is provided by the patient and medical records.    This is a 54 year old female with past medical history significant for asthma, presenting to the ED for productive cough, nasal congestion, and sinus pressure for the past week.  States yesterday she developed nausea, vomiting, and diarrhea-- diarrhea has since resolved.  She has had a few episodes of post-tussive emesis today.  States appetite decreased so poor PO intake recently.  Has tried to drink fluids.  States poor sleep because she wakes up often coughing.  No chest pain, SOB, palpitations, dizziness, weakness.  Patient does admit that her mother has recently been sick with similar symptoms.  Vital signs stable on arrival.  Past Medical History  Diagnosis Date  . Asthma    History reviewed. No pertinent past surgical history. No family history on file. History  Substance Use Topics  . Smoking status: Current Every Day Smoker  . Smokeless tobacco: Not on file  . Alcohol Use: Yes   OB History    No data available     Review of Systems  Respiratory: Positive for cough.   Gastrointestinal: Positive for nausea, vomiting and diarrhea.  All other systems reviewed and are negative.     Allergies  Review of patient's allergies indicates no known allergies.  Home Medications   Prior to Admission medications   Medication Sig Start Date End Date Taking? Authorizing Provider  albuterol (PROVENTIL HFA;VENTOLIN HFA) 108 (90 BASE) MCG/ACT inhaler Inhale 2 puffs into the lungs every 6 (six) hours as needed for wheezing or shortness of breath.    Historical Provider, MD  oxyCODONE (OXY IR/ROXICODONE) 5 MG immediate release tablet Take 5 mg by  mouth every 4 (four) hours as needed for severe pain.    Historical Provider, MD  oxyCODONE-acetaminophen (PERCOCET) 5-325 MG per tablet Take 1-2 tablets by mouth every 6 (six) hours as needed for severe pain. 10/01/13   Mercedes Strupp Camprubi-Soms, PA-C   BP 136/80 mmHg  Pulse 71  Temp(Src) 98.9 F (37.2 C) (Oral)  Resp 18  Ht 5\' 1"  (1.549 m)  Wt 145 lb (65.772 kg)  BMI 27.41 kg/m2  SpO2 100%   Physical Exam  Constitutional: She is oriented to person, place, and time. She appears well-developed and well-nourished. No distress.  HENT:  Head: Normocephalic and atraumatic.  Mouth/Throat: Oropharynx is clear and moist.  Eyes: Conjunctivae and EOM are normal. Pupils are equal, round, and reactive to light.  Neck: Normal range of motion. Neck supple.  Cardiovascular: Normal rate, regular rhythm and normal heart sounds.   Pulmonary/Chest: Effort normal and breath sounds normal. No respiratory distress. She has no wheezes.  Lungs clear bilaterally, dry cough noted  Abdominal: Soft. Bowel sounds are normal. There is no tenderness. There is no guarding.  Musculoskeletal: Normal range of motion.  Neurological: She is alert and oriented to person, place, and time.  Skin: Skin is warm and dry. She is not diaphoretic.  Psychiatric: She has a normal mood and affect.  Nursing note and vitals reviewed.   ED Course  Procedures (including critical care time) Labs Review Labs Reviewed  CBC WITH DIFFERENTIAL - Abnormal; Notable for the following:    Platelets 419 (*)  All other components within normal limits  COMPREHENSIVE METABOLIC PANEL - Abnormal; Notable for the following:    Glucose, Bld 109 (*)    BUN <5 (*)    All other components within normal limits  URINALYSIS, ROUTINE W REFLEX MICROSCOPIC    Imaging Review Dg Chest 2 View  02/19/2014   CLINICAL DATA:  Persistent cough, right side chest pain  EXAM: CHEST  2 VIEW  COMPARISON:  None.  FINDINGS: Cardiomediastinal silhouette is  unremarkable. No acute infiltrate or pleural effusion. No pulmonary edema. Mild degenerative changes mid thoracic spine.  IMPRESSION: No active cardiopulmonary disease.   Electronically Signed   By: Lahoma Crocker M.D.   On: 02/19/2014 15:56     EKG Interpretation None      MDM   Final diagnoses:  Diarrhea  Nausea and vomiting, vomiting of unspecified type   54 year old female with URI type symptoms for the past week, N/V/D developing yesterday.  States diarrhea has resolved, she has had a few episodes of posttussis emesis today. On exam, patient afebrile and nontoxic in appearance. Her lung sounds are clear bilaterally. She does have a noted drop cough. Abdominal exam is benign. Lab work is reassuring. Chest x-ray is clear. Patient was given IV fluids, Zofran, and test next with significant improvement of her symptoms. She has been able to tolerate PO fluids in the ED without difficulty. Repeat exam remains overall benign and noninfectious. Constellation of symptoms likely viral in nature. Encouraged supportive care, test next and Zofran for home.  Discussed plan with patient, he/she acknowledged understanding and agreed with plan of care.  Return precautions given for new or worsening symptoms.  Larene Pickett, PA-C 02/19/14 2153  Carmin Muskrat, MD 02/20/14 [phone]

## 2014-02-19 NOTE — ED Notes (Signed)
Pt reports productive cough, nvd x 1 week. Pt has been taking home albuterol and Mucinex without relief. C/o headache and back pain as well.

## 2014-02-19 NOTE — Discharge Instructions (Signed)
Take the prescribed medication as directed.  Continue drinking fluids to keep yourself hydrated. Return to the ED for new or worsening symptoms.

## 2014-02-19 NOTE — ED Notes (Signed)
Pt able to tolerate fluids without difficulty.

## 2014-04-29 ENCOUNTER — Encounter (HOSPITAL_COMMUNITY): Payer: Self-pay | Admitting: Emergency Medicine

## 2014-04-29 ENCOUNTER — Emergency Department (INDEPENDENT_AMBULATORY_CARE_PROVIDER_SITE_OTHER)
Admission: EM | Admit: 2014-04-29 | Discharge: 2014-04-29 | Disposition: A | Discharge status: Home / Self Care | Payer: Self-pay | Source: Home / Self Care | Attending: Family Medicine | Admitting: Family Medicine

## 2014-04-29 DIAGNOSIS — K088 Other specified disorders of teeth and supporting structures: Secondary | ICD-10-CM

## 2014-04-29 DIAGNOSIS — K047 Periapical abscess without sinus: Secondary | ICD-10-CM

## 2014-04-29 DIAGNOSIS — K0889 Other specified disorders of teeth and supporting structures: Secondary | ICD-10-CM

## 2014-04-29 MED ORDER — DICLOFENAC SODIUM 75 MG PO TBEC: 75.0000 mg | DELAYED_RELEASE_TABLET | Freq: Two times a day (BID) | ORAL | Status: DC | PRN

## 2014-04-29 MED ORDER — CLINDAMYCIN HCL 300 MG PO CAPS: 300.0000 mg | ORAL_CAPSULE | Freq: Three times a day (TID) | ORAL | Status: DC

## 2014-04-29 NOTE — ED Notes (Signed)
Call from [facility], patient wants something less expensive . Dr Georgina Snell sp[oke directly w pharmacy to arrange less expensive formulation

## 2014-04-29 NOTE — ED Notes (Signed)
Pt has been suffering from a toothache for about one week, right upper jaw.

## 2014-04-29 NOTE — Discharge Instructions (Signed)
Thank you for coming in today.  [place]:  [place] [phone] (ext [phone])  [place]  Please call Dr. Donn Pierini office [phone] or cell [phone] [address] [place] for tooth removal $200 includes exam, Xray, and extraction and follow up visit.  Bring list of current medications with you.   [facility] [place]  [address] [facility]  [place], [place], [place], [address]  [phone], Ext. [phone]  2nd and 4th Thursday of the month at 6:30am (Simple extractions only - no wisdom teeth or surgery) First come/First serve -First 10 clients served  [facility] [place], [place] and [place] residents only)  [address] [place], [place], [address]  [phone]  [place]  [phone]  [facility]  [phone]  Please call [facility] at [phone] to get the details to get your tooth pulled.    Dental Abscess A dental abscess is a collection of infected fluid (pus) from a bacterial infection in the inner part of the tooth (pulp). It usually occurs at the end of the tooth's root.  CAUSES   Severe tooth decay.  Trauma to the tooth that allows bacteria to enter into the pulp, such as a broken or chipped tooth. SYMPTOMS   Severe pain in and around the infected tooth.  Swelling and redness around the abscessed tooth or in the mouth or face.  Tenderness.  Pus drainage.  Bad breath.  Bitter taste in the mouth.  Difficulty swallowing.  Difficulty opening the mouth.  Nausea.  Vomiting.  Chills.  Swollen neck glands. DIAGNOSIS   A medical and dental history will be taken.  An examination will be performed by tapping on the abscessed tooth.  X-rays may be taken of the  tooth to identify the abscess. TREATMENT The goal of treatment is to eliminate the infection. You may be prescribed antibiotic medicine to stop the infection from spreading. A root canal may be performed to save the tooth. If the tooth cannot be saved, it may be pulled (extracted) and the abscess may be drained.  HOME CARE INSTRUCTIONS  Only take over-the-counter or prescription medicines for pain, fever, or discomfort as directed by your caregiver.  Rinse your mouth (gargle) often with salt water ( tsp salt in 8 oz [250 ml] of warm water) to relieve pain or swelling.  Do not drive after taking pain medicine (narcotics).  Do not apply heat to the outside of your face.  Return to your dentist for further treatment as directed. SEEK MEDICAL CARE IF:  Your pain is not helped by medicine.  Your pain is getting worse instead of better. SEEK IMMEDIATE MEDICAL CARE IF:  You have a fever or persistent symptoms for more than 2-3 days.  You have a fever and your symptoms suddenly get worse.  You have chills or a very bad headache.  You have problems breathing or swallowing.  You have trouble opening your mouth.  You have swelling in the neck or around the eye. Document Released: 02/01/2005 Document Revised: 10/27/2011 Document Reviewed: 05/12/2010 [facility] Patient Information 2015 [place], [place]. This information is not intended to replace advice given to you by your health care provider. Make sure you discuss any questions you have with your health care provider.

## 2014-04-29 NOTE — ED Provider Notes (Signed)
[name] is a 54 y.o. female who presents to Urgent Care today for Right upper tooth pain and swelling present for the last few days worsening today with facial swelling. No fevers or chills vomiting or diarrhea. Patient has tried multiple over-the-counter medications which have not helped much. She has not attempted to contact a dentist yet. No chest pains or palpitations.   Past Medical History  Diagnosis Date  . Asthma    No past surgical history on file. History  Substance Use Topics  . Smoking status: Current Every Day Smoker  . Smokeless tobacco: Not on file  . Alcohol Use: Yes   ROS as above Medications: No current facility-administered medications for this encounter.   Current Outpatient Prescriptions  Medication Sig Dispense Refill  . albuterol (PROVENTIL HFA;VENTOLIN HFA) 108 (90 BASE) MCG/ACT inhaler Inhale 2 puffs into the lungs every 6 (six) hours as needed for wheezing or shortness of breath.    . clindamycin (CLEOCIN) 300 MG capsule Take 1 capsule (300 mg total) by mouth 3 (three) times daily. 30 capsule 0  . diclofenac (VOLTAREN) 75 MG EC tablet Take 1 tablet (75 mg total) by mouth 2 (two) times daily as needed. 30 tablet 0   No Known Allergies   Exam:  BP 134/96 mmHg  Pulse 65  Temp(Src) 98.6 F (37 C) (Oral)  Resp 16  SpO2 99% Gen: Well NAD HEENT: EOMI,  MMM multiple oral caries. Eroded tooth at the right upper premolar tender to touch. Right facial area mildly tender to touch. Normal posterior pharynx and nasal turbinates. Lungs: Normal work of breathing. CTABL Heart: RRR no MRG Abd: NABS, Soft. Nondistended, Nontender Exts: Brisk capillary refill, warm and well perfused.   No results found for this or any previous visit (from the past 24 hour(s)). No results found.  Assessment and Plan: 54 y.o. female with dental pain and infection. Treat with clindamycin and diclofenac. Follow-up with a dentist.  Discussed warning signs or symptoms. Please see  discharge instructions. Patient expresses understanding.     Gregor Hams, MD 04/29/14 [phone]

## 2014-05-19 ENCOUNTER — Emergency Department (INDEPENDENT_AMBULATORY_CARE_PROVIDER_SITE_OTHER)
Admission: EM | Admit: 2014-05-19 | Discharge: 2014-05-19 | Disposition: A | Discharge status: Home / Self Care | Payer: Self-pay | Source: Home / Self Care | Attending: Emergency Medicine | Admitting: Emergency Medicine

## 2014-05-19 ENCOUNTER — Encounter (HOSPITAL_COMMUNITY): Payer: Self-pay | Admitting: Emergency Medicine

## 2014-05-19 DIAGNOSIS — H00016 Hordeolum externum left eye, unspecified eyelid: Secondary | ICD-10-CM

## 2014-05-19 MED ORDER — POLYMYXIN B-TRIMETHOPRIM 10000-0.1 UNIT/ML-% OP SOLN
1.0000 [drp] | OPHTHALMIC | Status: DC
Start: 2014-05-19 — End: 2015-05-01

## 2014-05-19 MED ORDER — CEPHALEXIN 500 MG PO CAPS: 500.0000 mg | ORAL_CAPSULE | Freq: Four times a day (QID) | ORAL | Status: DC

## 2014-05-19 NOTE — ED Notes (Signed)
C/o left pink eye for three days States she has facial swelling Does have drainage coming from eye Has used a hot rag and egg white as tx States when she wakes up her eye is matted together

## 2014-05-19 NOTE — Discharge Instructions (Signed)
You have a stye. Continue to apply warm compresses. Use the antibiotic eyedrops every 4-6 hours. Take Keflex one pill 4 times a day for 7 days. If there is no improvement by Tuesday, please make an appointment with the eye doctor.

## 2014-05-19 NOTE — ED Provider Notes (Signed)
CSN: [id number]     Arrival date & time 05/19/14  1429 History   First MD Initiated Contact with Patient 05/19/14 1544     Chief Complaint  Patient presents with  . Conjunctivitis   (Consider location/radiation/quality/duration/timing/severity/associated sxs/prior Treatment) HPI She is a 54 year old woman here for evaluation of left eye drainage. She states this is been going on for about 3 days. She has swelling and drainage from her lower left eyelid. She states she had a stye on her left upper eyelid last week, but was able to get rid of it with warm compresses. The one on her lower eyelid has been persistent despite warm compresses. She reports matted eye in the morning. She has redness and swelling around the lower lid. No fevers or chills. No nausea or vomiting. No change in her vision.  Past Medical History  Diagnosis Date  . Asthma    History reviewed. No pertinent past surgical history. History reviewed. No pertinent family history. History  Substance Use Topics  . Smoking status: Current Every Day Smoker -- 0.50 packs/day  . Smokeless tobacco: Not on file  . Alcohol Use: Yes   OB History    No data available     Review of Systems  Constitutional: Negative for fever.  Eyes: Positive for pain and discharge. Negative for visual disturbance.  Gastrointestinal: Negative for nausea.    Allergies  Review of patient's allergies indicates no known allergies.  Home Medications   Prior to Admission medications   Medication Sig Start Date End Date Taking? Authorizing Provider  albuterol (PROVENTIL HFA;VENTOLIN HFA) 108 (90 BASE) MCG/ACT inhaler Inhale 2 puffs into the lungs every 6 (six) hours as needed for wheezing or shortness of breath.    Historical Provider, MD  cephALEXin (KEFLEX) 500 MG capsule Take 1 capsule (500 mg total) by mouth 4 (four) times daily. 05/19/14   Melony Overly, MD  clindamycin (CLEOCIN) 300 MG capsule Take 1 capsule (300 mg total) by mouth 3 (three) times  daily. 04/29/14   Gregor Hams, MD  diclofenac (VOLTAREN) 75 MG EC tablet Take 1 tablet (75 mg total) by mouth 2 (two) times daily as needed. 04/29/14   Gregor Hams, MD  trimethoprim-polymyxin b (POLYTRIM) ophthalmic solution Place 1 drop into the left eye every 4 (four) hours. 05/19/14   Melony Overly, MD   BP 124/86 mmHg  Pulse 65  Temp(Src) 97.7 F (36.5 C) (Oral)  Resp 18  SpO2 96%  LMP 05/13/2014 (Approximate) Physical Exam  Constitutional: She is oriented to person, place, and time. She appears well-developed and well-nourished. No distress.  HENT:  Head: Normocephalic and atraumatic.  Eyes: Conjunctivae and EOM are normal. Pupils are equal, round, and reactive to light. Left eye exhibits hordeolum.    Cardiovascular: Normal rate.   Pulmonary/Chest: Effort normal.  Neurological: She is alert and oriented to person, place, and time.    ED Course  Procedures (including critical care time) Labs Review Labs Reviewed - No data to display  Imaging Review No results found.   MDM   1. Stye external, left    Purulent drainage seen. Concern for developing preseptal cellulitis. Treat with Polytrim eyedrops and Keflex. Follow-up with ophthalmologist if no improvement in 48 hours.    Melony Overly, MD 05/19/14 [phone]

## 2014-12-13 ENCOUNTER — Emergency Department (HOSPITAL_COMMUNITY)
Admission: EM | Admit: 2014-12-13 | Discharge: 2014-12-14 | Disposition: A | Discharge status: Home / Self Care | Payer: Self-pay | Attending: Emergency Medicine | Admitting: Emergency Medicine

## 2014-12-13 ENCOUNTER — Encounter (HOSPITAL_COMMUNITY): Payer: Self-pay | Admitting: *Deleted

## 2014-12-13 ENCOUNTER — Emergency Department (HOSPITAL_COMMUNITY): Payer: Self-pay

## 2014-12-13 DIAGNOSIS — Y9289 Other specified places as the place of occurrence of the external cause: Secondary | ICD-10-CM | POA: Insufficient documentation

## 2014-12-13 DIAGNOSIS — J45909 Unspecified asthma, uncomplicated: Secondary | ICD-10-CM | POA: Insufficient documentation

## 2014-12-13 DIAGNOSIS — Y998 Other external cause status: Secondary | ICD-10-CM | POA: Insufficient documentation

## 2014-12-13 DIAGNOSIS — Y9389 Activity, other specified: Secondary | ICD-10-CM | POA: Insufficient documentation

## 2014-12-13 DIAGNOSIS — Z792 Long term (current) use of antibiotics: Secondary | ICD-10-CM | POA: Insufficient documentation

## 2014-12-13 DIAGNOSIS — S8002XA Contusion of left knee, initial encounter: Secondary | ICD-10-CM | POA: Insufficient documentation

## 2014-12-13 DIAGNOSIS — Z79899 Other long term (current) drug therapy: Secondary | ICD-10-CM | POA: Insufficient documentation

## 2014-12-13 DIAGNOSIS — Z720 Tobacco use: Secondary | ICD-10-CM | POA: Insufficient documentation

## 2014-12-13 DIAGNOSIS — W182XXA Fall in (into) shower or empty bathtub, initial encounter: Secondary | ICD-10-CM | POA: Insufficient documentation

## 2014-12-13 MED ORDER — IBUPROFEN 800 MG PO TABS: 800.0000 mg | ORAL_TABLET | Freq: Three times a day (TID) | ORAL | Status: DC

## 2014-12-13 NOTE — ED Notes (Signed)
Patient transported to X-ray 

## 2014-12-13 NOTE — ED Provider Notes (Signed)
CSN: [id number]     Arrival date & time 12/13/14  1954 History  By signing my name below, I, Hansel Feinstein, attest that this documentation has been prepared under the direction and in the presence of Charlann Lange, PA-C. Electronically Signed: Hansel Feinstein, ED Scribe. 12/13/2014. 10:47 PM.    Chief Complaint  Patient presents with  . Fall  . Knee Pain   The history is provided by the patient. No language interpreter was used.    HPI Comments: [name] is a 54 y.o. female who presents to the Emergency Department complaining of a left knee injury onset last night after a mechanical fall in the bathtub. Pt states that she slipped in the tub and landed directly on the knee. No LOC, no head injury. Pt states associated moderate, gradually worsening left knee pain and swelling. Pt is ambulatory. She states no relief with OTC pain medication and elevation. No other injuries. She denies abdominal pain, CP.    Past Medical History  Diagnosis Date  . Asthma    History reviewed. No pertinent past surgical history. History reviewed. No pertinent family history. Social History  Substance Use Topics  . Smoking status: Current Every Day Smoker -- 0.50 packs/day  . Smokeless tobacco: None  . Alcohol Use: Yes   OB History    No data available     Review of Systems  Cardiovascular: Negative for chest pain.  Gastrointestinal: Negative for abdominal distention.  Musculoskeletal: Positive for joint swelling and arthralgias.   Allergies  Review of patient's allergies indicates no known allergies.  Home Medications   Prior to Admission medications   Medication Sig Start Date End Date Taking? Authorizing Provider  albuterol (PROVENTIL HFA;VENTOLIN HFA) 108 (90 BASE) MCG/ACT inhaler Inhale 2 puffs into the lungs every 6 (six) hours as needed for wheezing or shortness of breath.    Historical Provider, MD  cephALEXin (KEFLEX) 500 MG capsule Take 1 capsule (500 mg total) by mouth 4 (four) times  daily. 05/19/14   Melony Overly, MD  clindamycin (CLEOCIN) 300 MG capsule Take 1 capsule (300 mg total) by mouth 3 (three) times daily. 04/29/14   Gregor Hams, MD  diclofenac (VOLTAREN) 75 MG EC tablet Take 1 tablet (75 mg total) by mouth 2 (two) times daily as needed. 04/29/14   Gregor Hams, MD  trimethoprim-polymyxin b (POLYTRIM) ophthalmic solution Place 1 drop into the left eye every 4 (four) hours. 05/19/14   Melony Overly, MD   BP 119/88 mmHg  Pulse 100  Temp(Src) 99.2 F (37.3 C) (Oral)  Resp 20  Ht 5\' 1"  (1.549 m)  Wt 155 lb (70.308 kg)  BMI 29.30 kg/m2  SpO2 100%  LMP 12/03/2014 Physical Exam  Constitutional: She is oriented to person, place, and time. She appears well-developed and well-nourished.  HENT:  Head: Normocephalic and atraumatic.  Eyes: Conjunctivae and EOM are normal. Pupils are equal, round, and reactive to light.  Neck: Normal range of motion. Neck supple.  Cardiovascular: Normal rate.   Pulmonary/Chest: Effort normal. No respiratory distress.  Abdominal: She exhibits no distension.  Musculoskeletal: Normal range of motion. She exhibits edema and tenderness.  Left knee has tenderness across the anterior aspect. No deformity. There is a moderate effusion. DPs intact. No calf or thigh tenderness.   Neurological: She is alert and oriented to person, place, and time.  Skin: Skin is warm and dry.  Psychiatric: She has a normal mood and affect. Her behavior is normal.  Nursing note and vitals reviewed.  ED Course  Procedures (including critical care time) DIAGNOSTIC STUDIES: Oxygen Saturation is 100% on RA, normal by my interpretation.    COORDINATION OF CARE: 10:44 PM Discussed treatment plan with pt at bedside and pt agreed to plan.   Imaging Review No results found. I have personally reviewed and evaluated these images as part of my medical decision-making. Dg Knee Complete 4 Views Left  12/13/2014  CLINICAL DATA:  Status post fall, medial knee pain EXAM:  LEFT KNEE - COMPLETE 4+ VIEW COMPARISON:  10/01/2013 FINDINGS: No evidence of acute fracture or dislocation. Deformity related to old lateral tibial plateau fracture. Associated mild degenerative changes. Visualized soft tissues are within normal limits. No suprapatellar knee joint effusion. IMPRESSION: No evidence of acute fracture or dislocation. Deformity/degenerative changes related to old lateral tibial plateau fracture. Electronically Signed   By: Julian Hy M.D.   On: 12/13/2014 23:11    MDM   Final diagnoses:  None  1. Left knee contusion  Non-fracture contusion injury to left knee requiring supportive are.   I personally performed the services described in this documentation, which was scribed in my presence. The recorded information has been reviewed and is accurate.    Charlann Lange, PA-C 12/13/14 2325  Leo Grosser, MD 12/14/14 [phone]

## 2014-12-13 NOTE — ED Notes (Signed)
Pt doesn't want crutches,  She says she has some at home

## 2014-12-13 NOTE — ED Notes (Signed)
Pt states she fell yesterday afternoon late evening in tub and has left knee pain,  Denies any other injury,  Pt is ambulatory

## 2014-12-13 NOTE — Discharge Instructions (Signed)
Contusion °A contusion is a deep bruise. Contusions are the result of a blunt injury to tissues and muscle fibers under the skin. The injury causes bleeding under the skin. The skin overlying the contusion may turn blue, purple, or yellow. Minor injuries will give you a painless contusion, but more severe contusions may stay painful and swollen for a few weeks.  °CAUSES  °This condition is usually caused by a blow, trauma, or direct force to an area of the body. °SYMPTOMS  °Symptoms of this condition include: °· Swelling of the injured area. °· Pain and tenderness in the injured area. °· Discoloration. The area may have redness and then turn blue, purple, or yellow. °DIAGNOSIS  °This condition is diagnosed based on a physical exam and medical history. An X-ray, CT scan, or MRI may be needed to determine if there are any associated injuries, such as broken bones (fractures). °TREATMENT  °Specific treatment for this condition depends on what area of the body was injured. In general, the best treatment for a contusion is resting, icing, applying pressure to (compression), and elevating the injured area. This is often called the RICE strategy. Over-the-counter anti-inflammatory medicines may also be recommended for pain control.  °HOME CARE INSTRUCTIONS  °· Rest the injured area. °· If directed, apply ice to the injured area: °· Put ice in a plastic bag. °· Place a towel between your skin and the bag. °· Leave the ice on for 20 minutes, 2-3 times per day. °· If directed, apply light compression to the injured area using an elastic bandage. Make sure the bandage is not wrapped too tightly. Remove and reapply the bandage as directed by your health care provider. °· If possible, raise (elevate) the injured area above the level of your heart while you are sitting or lying down. °· Take over-the-counter and prescription medicines only as told by your health care provider. °SEEK MEDICAL CARE IF: °· Your symptoms do not  improve after several days of treatment. °· Your symptoms get worse. °· You have difficulty moving the injured area. °SEEK IMMEDIATE MEDICAL CARE IF:  °· You have severe pain. °· You have numbness in a hand or foot. °· Your hand or foot turns pale or cold. °  °This information is not intended to replace advice given to you by your health care provider. Make sure you discuss any questions you have with your health care provider. °  °Document Released: 11/11/2004 Document Revised: 10/23/2014 Document Reviewed: 06/19/2014 °Elsevier Interactive Patient Education ©2016 [employer] ° °Cryotherapy °Cryotherapy means treatment with cold. Ice or gel packs can be used to reduce both pain and swelling. Ice is the most helpful within the first 24 to 48 hours after an injury or flare-up from overusing a muscle or joint. Sprains, strains, spasms, burning pain, shooting pain, and aches can all be eased with ice. Ice can also be used when recovering from surgery. Ice is effective, has very few side effects, and is safe for most people to use. °PRECAUTIONS  °Ice is not a safe treatment option for people with: °· Raynaud phenomenon. This is a condition affecting small blood vessels in the extremities. Exposure to cold may cause your problems to return. °· Cold hypersensitivity. There are many forms of cold hypersensitivity, including: °¨ Cold urticaria. Red, itchy hives appear on the skin when the tissues begin to warm after being iced. °¨ Cold erythema. This is a red, itchy rash caused by exposure to cold. °¨ Cold hemoglobinuria. Red blood cells   break down when the tissues begin to warm after being iced. The hemoglobin that carry oxygen are passed into the urine because they cannot combine with blood proteins fast enough. °· Numbness or altered sensitivity in the area being iced. °If you have any of the following conditions, do not use ice until you have discussed cryotherapy with your caregiver: °· Heart conditions, such as  arrhythmia, angina, or chronic heart disease. °· High blood pressure. °· Healing wounds or open skin in the area being iced. °· Current infections. °· Rheumatoid arthritis. °· Poor circulation. °· Diabetes. °Ice slows the blood flow in the region it is applied. This is beneficial when trying to stop inflamed tissues from spreading irritating chemicals to surrounding tissues. However, if you expose your skin to cold temperatures for too long or without the proper protection, you can damage your skin or nerves. Watch for signs of skin damage due to cold. °HOME CARE INSTRUCTIONS °Follow these tips to use ice and cold packs safely. °· Place a dry or damp towel between the ice and skin. A damp towel will cool the skin more quickly, so you may need to shorten the time that the ice is used. °· For a more rapid response, add gentle compression to the ice. °· Ice for no more than 10 to 20 minutes at a time. The bonier the area you are icing, the less time it will take to get the benefits of ice. °· Check your skin after 5 minutes to make sure there are no signs of a poor response to cold or skin damage. °· Rest 20 minutes or more between uses. °· Once your skin is numb, you can end your treatment. You can test numbness by very lightly touching your skin. The touch should be so light that you do not see the skin dimple from the pressure of your fingertip. When using ice, most people will feel these normal sensations in this order: cold, burning, aching, and numbness. °· Do not use ice on someone who cannot communicate their responses to pain, such as small children or people with dementia. °HOW TO MAKE AN ICE PACK °Ice packs are the most common way to use ice therapy. Other methods include ice massage, ice baths, and cryosprays. Muscle creams that cause a cold, tingly feeling do not offer the same benefits that ice offers and should not be used as a substitute unless recommended by your caregiver. °To make an ice pack, do one  of the following: °· Place crushed ice or a bag of frozen vegetables in a sealable plastic bag. Squeeze out the excess air. Place this bag inside another plastic bag. Slide the bag into a pillowcase or place a damp towel between your skin and the bag. °· Mix 3 parts water with 1 part rubbing alcohol. Freeze the mixture in a sealable plastic bag. When you remove the mixture from the freezer, it will be slushy. Squeeze out the excess air. Place this bag inside another plastic bag. Slide the bag into a pillowcase or place a damp towel between your skin and the bag. °SEEK MEDICAL CARE IF: °· You develop white spots on your skin. This may give the skin a blotchy (mottled) appearance. °· Your skin turns blue or pale. °· Your skin becomes waxy or hard. °· Your swelling gets worse. °MAKE SURE YOU:  °· Understand these instructions. °· Will watch your condition. °· Will get help right away if you are not doing well or get worse. °  °  This information is not intended to replace advice given to you by your health care provider. Make sure you discuss any questions you have with your health care provider. °  °Document Released: 09/28/2010 Document Revised: 02/22/2014 Document Reviewed: 09/28/2010 °Elsevier Interactive Patient Education ©2016 Elsevier Inc. ° °

## 2015-04-30 ENCOUNTER — Telehealth: Payer: Self-pay | Admitting: General Practice

## 2015-04-30 NOTE — Telephone Encounter (Signed)
APPT. REMINDER CALL, NO ANSWER/NO VOICE MAIL °

## 2015-05-01 ENCOUNTER — Encounter: Payer: Self-pay | Admitting: Internal Medicine

## 2015-05-01 ENCOUNTER — Ambulatory Visit (INDEPENDENT_AMBULATORY_CARE_PROVIDER_SITE_OTHER): Payer: Self-pay | Admitting: Internal Medicine

## 2015-05-01 VITALS — BP 133/82 | HR 70 | Temp 98.3°F | Wt 167.8 lb

## 2015-05-01 DIAGNOSIS — Z0000 Encounter for general adult medical examination without abnormal findings: Secondary | ICD-10-CM | POA: Insufficient documentation

## 2015-05-01 DIAGNOSIS — Z8249 Family history of ischemic heart disease and other diseases of the circulatory system: Secondary | ICD-10-CM

## 2015-05-01 DIAGNOSIS — J45909 Unspecified asthma, uncomplicated: Secondary | ICD-10-CM

## 2015-05-01 DIAGNOSIS — J4520 Mild intermittent asthma, uncomplicated: Secondary | ICD-10-CM

## 2015-05-01 DIAGNOSIS — Z833 Family history of diabetes mellitus: Secondary | ICD-10-CM

## 2015-05-01 MED ORDER — ALBUTEROL SULFATE HFA 108 (90 BASE) MCG/ACT IN AERS: 2.0000 | INHALATION_SPRAY | Freq: Four times a day (QID) | RESPIRATORY_TRACT | Status: DC | PRN

## 2015-05-01 NOTE — Progress Notes (Signed)
   Subjective:    Patient ID: [name], female    DOB: 1960/12/30, 55 y.o.   MRN: [id number]  HPI  55 yo female with here to establish care as a new patient.   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   Also has intermittent sob/cough, takes albuterol to help with this. Gets it from the ED usually as she didn't have a PCP.  Denies any complaint now.  FH: mother with HTN, father diet at 19 from heart attack, maternal aunt had metastatic breast cancer, MGM had DM II.  SH: <1/2ppd cigarette smoker for 10 years, drinks alcohol occasionally, no drug use. Single, sexually active with 1 partner, works as a Printmaker.  PSXH: vulvectomy for vulvar cancer,  Review of Systems  Constitutional: Negative for fever and chills.  HENT: Negative for congestion and sore throat.   Respiratory: Negative for chest tightness, shortness of breath and wheezing.   Cardiovascular: Negative for chest pain, palpitations and leg swelling.  Gastrointestinal: Negative for nausea, vomiting, abdominal pain, diarrhea and abdominal distention.  Endocrine: Negative.   Genitourinary: Negative for dysuria and flank pain.  Musculoskeletal: Negative for back pain and arthralgias.  Skin: Negative.   Neurological: Negative for dizziness and facial asymmetry.  Hematological: Negative.   Psychiatric/Behavioral: Negative.        Objective:   Physical Exam  Constitutional: She is oriented to person, place, and time. She appears well-developed and well-nourished.  HENT:  Head: Normocephalic and atraumatic.  Eyes: Conjunctivae and EOM are normal. Pupils are equal, round, and reactive to light.  Neck: Normal range of motion. Neck supple. No JVD present.  Cardiovascular: Normal rate and regular rhythm.  Exam reveals no gallop and no friction rub.   No murmur heard. Pulmonary/Chest: Effort normal and breath sounds normal.  No respiratory distress. She has no wheezes.  Abdominal: Soft. Bowel sounds are normal. She exhibits no distension. There is no tenderness.  Musculoskeletal: Normal range of motion. She exhibits no edema or tenderness.  Neurological: She is alert and oriented to person, place, and time. No cranial nerve deficit.    Filed Vitals:   05/01/15 1536  BP: 133/82  Pulse: 70  Temp: 98.3 F (36.8 C)         Assessment & Plan:  See problem based a&p.

## 2015-05-01 NOTE — Patient Instructions (Signed)
Please get your mammogram done.  Return the stool card.  Will check your cholesterol.  Let us know if you need anything. F/up in 3 months.

## 2015-05-01 NOTE — Assessment & Plan Note (Signed)
She is over 54. Needs csope, will defer until she has insurance/orange card - will do orange card.  Needs pap especially with her hx of cervical and vulvar cancer. She will make separate appt for this.  Needs mammogram screening - gave her scholarship for this.   She is over 47, will check lipid panel.

## 2015-05-01 NOTE — Assessment & Plan Note (Signed)
Has hx of reactive airway disease/bronchitis. Always goes to ED to get refill of albuterol. This may be related to environmental allergies.  - will do PFT when she has insurance - refilled her albuterol PRN - asked to take OTC zyrtec daily for allergies.

## 2015-05-02 LAB — LIPID PANEL
CHOL/HDL RATIO: 2.6 ratio (ref 0.0–4.4)
CHOLESTEROL TOTAL: 194 mg/dL (ref 100–199)
HDL: 74 mg/dL (ref >39)
LDL Calculated: 82 mg/dL (ref 0–99)
TRIGLYCERIDES: 188 mg/dL — AB (ref 0–149)
VLDL Cholesterol Cal: 38 mg/dL (ref 5–40)

## 2015-05-05 ENCOUNTER — Other Ambulatory Visit: Payer: Self-pay | Admitting: Internal Medicine

## 2015-05-08 ENCOUNTER — Other Ambulatory Visit (HOSPITAL_COMMUNITY)
Admission: RE | Admit: 2015-05-08 | Discharge: 2015-05-08 | Disposition: A | Discharge status: Home / Self Care | Payer: Medicaid Other | Source: Ambulatory Visit | Attending: Internal Medicine | Admitting: Internal Medicine

## 2015-05-08 ENCOUNTER — Ambulatory Visit (INDEPENDENT_AMBULATORY_CARE_PROVIDER_SITE_OTHER): Payer: Self-pay | Admitting: Internal Medicine

## 2015-05-08 ENCOUNTER — Encounter: Payer: Self-pay | Admitting: Internal Medicine

## 2015-05-08 VITALS — BP 104/72 | HR 68 | Temp 98.4°F | Resp 18 | Ht 61.0 in | Wt 165.7 lb

## 2015-05-08 DIAGNOSIS — Z124 Encounter for screening for malignant neoplasm of cervix: Secondary | ICD-10-CM

## 2015-05-08 DIAGNOSIS — Z1211 Encounter for screening for malignant neoplasm of colon: Secondary | ICD-10-CM

## 2015-05-08 DIAGNOSIS — Z01419 Encounter for gynecological examination (general) (routine) without abnormal findings: Secondary | ICD-10-CM | POA: Insufficient documentation

## 2015-05-08 DIAGNOSIS — Z0000 Encounter for general adult medical examination without abnormal findings: Secondary | ICD-10-CM | POA: Insufficient documentation

## 2015-05-08 DIAGNOSIS — Z1151 Encounter for screening for human papillomavirus (HPV): Secondary | ICD-10-CM | POA: Insufficient documentation

## 2015-05-08 LAB — POC HEMOCCULT BLD/STL (HOME/3-CARD/SCREEN)
FECAL OCCULT BLD: NEGATIVE
FECAL OCCULT BLD: NEGATIVE
FECAL OCCULT BLD: NEGATIVE

## 2015-05-08 NOTE — Progress Notes (Signed)
Internal Medicine Clinic Attending  Case discussed with Dr. Ahmed at the time of the visit.  We reviewed the resident's history and exam and pertinent patient test results.  I agree with the assessment, diagnosis, and plan of care documented in the resident's note. 

## 2015-05-08 NOTE — Assessment & Plan Note (Signed)
Unable to visualize cervix. Had some blood in the vaginal canal. She did mention she has spotting occasionally and irregular periods but this may be perimenopausal.  Obtained pap sample from vaginal wall. Will refer to gynecology for further testing. She will be better served there with her hx of vulvar cancer anyway.

## 2015-05-08 NOTE — Progress Notes (Signed)
   Subjective:    Patient ID: [name], female    DOB: [birth year], 55 y.o.   MRN: [id number]  HPI  55 yo female with reactive airway disease, hx of cervical and vulvar cancer s/p vulvectomy on 2004, here for pap smear.  Denies any vaginal bleeding, discharge, or pain. But did notice some spotting for last 6 months and irregular periods. She thinks she may be going through menopause. Which I think may be true, she is 55 years old.   Doing well. No complaints since last visit.    Review of Systems  Constitutional: Negative.   HENT: Negative for congestion and sore throat.   Eyes: Negative.   Respiratory: Negative for chest tightness and shortness of breath.   Cardiovascular: Negative for chest pain, palpitations and leg swelling.  Gastrointestinal: Negative for abdominal pain and abdominal distention.  Endocrine: Negative.   Genitourinary: Negative for dysuria, hematuria, flank pain, vaginal bleeding, vaginal discharge, genital sores, vaginal pain and pelvic pain.  Musculoskeletal: Negative.   Skin: Negative.   Allergic/Immunologic: Negative.   Neurological: Negative.   Hematological: Negative.   Psychiatric/Behavioral: Negative.        Objective:   Physical Exam  Constitutional: She appears well-developed and well-nourished. No distress.  HENT:  Head: Normocephalic and atraumatic.  Eyes: EOM are normal. Pupils are equal, round, and reactive to light.  Genitourinary:  Labia removed previously.   Unable to visualize cervix.  Had small amount of blood in the vaginal canal. No discharge.    Took pap sample from vaginal wall.   Skin: She is not diaphoretic.      Filed Vitals:   05/08/15 1045  BP: 104/72  Pulse: 68  Temp: 98.4 F (36.9 C)  Resp: 18       Assessment & Plan:  See problem based a&p.

## 2015-05-08 NOTE — Patient Instructions (Signed)
Please follow up with gynocology.  Follow up with [place] as needed.

## 2015-05-13 LAB — CYTOLOGY - PAP

## 2015-05-22 ENCOUNTER — Telehealth: Payer: Self-pay | Admitting: *Deleted

## 2015-05-22 NOTE — Telephone Encounter (Signed)
erroneous encounter- phone note opened in error - no phone call made to pt. Yvonna Alanis, RN, 05/22/15 - 6:21 PM

## 2015-06-13 ENCOUNTER — Ambulatory Visit: Payer: Medicaid Other | Admitting: Gynecology

## 2015-07-04 ENCOUNTER — Encounter: Payer: Self-pay | Admitting: Gynecology

## 2015-07-04 ENCOUNTER — Ambulatory Visit: Payer: Self-pay | Attending: Gynecology | Admitting: Gynecology

## 2015-07-04 VITALS — BP 124/67 | HR 69 | Temp 98.4°F | Resp 18 | Ht 61.0 in | Wt 160.6 lb

## 2015-07-04 DIAGNOSIS — Z8589 Personal history of malignant neoplasm of other organs and systems: Secondary | ICD-10-CM | POA: Insufficient documentation

## 2015-07-04 DIAGNOSIS — J45909 Unspecified asthma, uncomplicated: Secondary | ICD-10-CM | POA: Insufficient documentation

## 2015-07-04 DIAGNOSIS — Z79899 Other long term (current) drug therapy: Secondary | ICD-10-CM | POA: Insufficient documentation

## 2015-07-04 DIAGNOSIS — F17210 Nicotine dependence, cigarettes, uncomplicated: Secondary | ICD-10-CM | POA: Insufficient documentation

## 2015-07-04 DIAGNOSIS — N950 Postmenopausal bleeding: Secondary | ICD-10-CM

## 2015-07-04 NOTE — Progress Notes (Signed)
Consult Note: Gyn-Onc   [name] 55 y.o. female  Chief Complaint  Patient presents with  . New Patient (Initial Visit)    Post Menopausal Bleeding    Assessment :Perimenopausal bleeding.  Plan: I was unable to perform an endometrial biopsy due to inability to identify the cervix which seems to be deviated posteriorly. We will obtain a pelvic ultrasound to evaluate the uterus before proceeding but it is likely she'll require a D&C.  HPI: 55 year old left American female seen in consultation at the request Dr.Ahmed regarding abnormal uterine bleeding. The patient reports that she has not actually undergone menopause. She has an occasional episode of bleeding consistent with a menstrual cycle but then has considerable amount of intermenstrual spotting. She denies any abdominal or pelvic cramping or any other pelvic symptoms.  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 She had negative margins and negative nodes is been followed since that time with no evidence recurrent disease.  Review of Systems:10 point review of systems is negative except as noted in interval history.   Vitals: Blood pressure 124/67, pulse 69, temperature 98.4 F (36.9 C), temperature source Oral, resp. rate 18, height 5\' 1"  (1.549 m), weight 160 lb 9.6 oz (72.848 kg), SpO2 100 %.  Physical Exam: General : The patient is a healthy woman in no acute distress.  HEENT: normocephalic, extraoccular movements normal; neck is supple without thyromegally  Lynphnodes: Supraclavicular and inguinal nodes not enlarged  Abdomen: Soft, non-tender, no ascites, no organomegally, no masses, no hernias  Pelvic:  EGBUS: Normal female  Vagina: Normal, no lesions  Urethra and Bladder: Normal, non-tender  Cervix: Deviated posteriorly and difficult to identify area I'm unable to sound the cervix Uterus: Anterior essentially normal size but firm consistent with a possible  fibroid. Bi-manual examination: Non-tender; no adenxal masses or nodularity  Rectal: normal sphincter tone, no masses, no blood  Lower extremities: No edema or varicosities. Normal range of motion     Tempted to blindly sound the uterus and cervix with a Pipelle was unable to enter in the canal.     No Known Allergies  Past Medical History  Diagnosis Date  . Asthma     Past Surgical History  Procedure Laterality Date  . Vulvectomy      for vulvar cancer.     Current Outpatient Prescriptions  Medication Sig Dispense Refill  . albuterol (PROVENTIL HFA;VENTOLIN HFA) 108 (90 Base) MCG/ACT inhaler Inhale 2 puffs into the lungs every 6 (six) hours as needed for wheezing or shortness of breath. 18 g 0   No current facility-administered medications for this visit.    Social History   Social History  . Marital Status: Single    Spouse Name: N/A  . Number of Children: N/A  . Years of Education: N/A   Occupational History  . Not on file.   Social History Main Topics  . Smoking status: Current Every Day Smoker -- 0.50 packs/day  . Smokeless tobacco: Not on file  . Alcohol Use: 1.2 oz/week    2 Standard drinks or equivalent per week  . Drug Use: No  . Sexual Activity: Yes   Other Topics Concern  . Not on file   Social History Narrative   Works as a Printmaker    Family History  Problem Relation Age of Onset  . Hypertension Mother   . Heart attack Father   . Diabetes Maternal Grandmother   . Cancer  Maternal Aunt       Alvino Chapel, MD 07/04/2015, 10:03 AM

## 2015-07-04 NOTE — Patient Instructions (Signed)
We will contact you with the results of your ultrasound and to discuss Dr. Mora Bellman further recommendations.  Please call for any questions or concerns.

## 2015-07-09 ENCOUNTER — Ambulatory Visit (HOSPITAL_COMMUNITY): Payer: Medicaid Other

## 2015-07-10 ENCOUNTER — Ambulatory Visit (HOSPITAL_COMMUNITY): Admission: RE | Admit: 2015-07-10 | Payer: Medicaid Other | Source: Ambulatory Visit

## 2015-07-21 ENCOUNTER — Ambulatory Visit (HOSPITAL_COMMUNITY)
Admission: RE | Admit: 2015-07-21 | Discharge: 2015-07-21 | Disposition: A | Discharge status: Home / Self Care | Payer: Medicaid Other | Source: Ambulatory Visit | Attending: Gynecologic Oncology | Admitting: Gynecologic Oncology

## 2015-07-21 DIAGNOSIS — N950 Postmenopausal bleeding: Secondary | ICD-10-CM

## 2015-07-21 DIAGNOSIS — D259 Leiomyoma of uterus, unspecified: Secondary | ICD-10-CM | POA: Insufficient documentation

## 2015-07-21 DIAGNOSIS — N852 Hypertrophy of uterus: Secondary | ICD-10-CM | POA: Insufficient documentation

## 2015-07-21 DIAGNOSIS — N83291 Other ovarian cyst, right side: Secondary | ICD-10-CM | POA: Insufficient documentation

## 2015-07-22 ENCOUNTER — Telehealth: Payer: Self-pay

## 2015-07-22 NOTE — Telephone Encounter (Signed)
Orders received from [place] to contact the patient with results from her US Pelvis Complete and US Transvaginal Non-OB , Dr Quillian Quince Clarke-Pearson's recommendations are a D&C. Attempted to contact the patient , uneventful , left a detailed message to return call to schedule the [facility] per MD orders , left our contact information . Updated Joylene John, APNP that a detailed voice message was given with request for call back.

## 2015-07-29 NOTE — Telephone Encounter (Signed)
Second call placed to attempt to reach the patient to scheduled her D & C per Dr Almon Hercules recommendations. Patient contacted with date and time of procedure for Dilation and Curettage with Dr [name] on June 22 , 2017. Writer explained the procedure and recovery with the patient , patient states understanding . Patient states she has to discuss the date with her employer and will contact us tomorrow if it has been approved , Joylene John, APNP aware.

## 2015-07-31 ENCOUNTER — Encounter (HOSPITAL_BASED_OUTPATIENT_CLINIC_OR_DEPARTMENT_OTHER): Payer: Self-pay | Admitting: Certified Registered"

## 2015-08-05 ENCOUNTER — Telehealth: Payer: Self-pay | Admitting: *Deleted

## 2015-08-05 ENCOUNTER — Telehealth: Payer: Self-pay

## 2015-08-05 NOTE — Telephone Encounter (Signed)
Orders received from [name] to contact the patient to follow up on an incoming call from our [name] that the patient is requesting to rescheduled her surgical procedure with Dr [name] for Dilation and Curettage on August 07, 2015 .Attempted to reach the patient , no answer , left a detailed message with call back requested, our contact information was provided, [name] , APNP was updated.

## 2015-08-05 NOTE — Telephone Encounter (Signed)
Received a call from [facility] in pre-surgical testing stating that the patient did not want to proceed with her scheduled procedure on 08/07/15. Pt was called to verify . Pt stated " I need to reschedule  because my mom just had a stroke and I need to care for her and I need to check with my job". Pt was advised that the RN or NP would be contacting her to reschedule her procedure.

## 2015-08-06 ENCOUNTER — Telehealth: Payer: Self-pay | Admitting: Gynecologic Oncology

## 2015-08-06 NOTE — Progress Notes (Signed)
Patient's scheduled surgery on August 07, 2015 with Dr [name] for D & C  was cancelled per patient's request , her mother had a "stroke" and has time off work related issues.

## 2015-08-06 NOTE — Telephone Encounter (Signed)
Left message for patient to call the office to discuss her questions about Dr. Mora Bellman recommendations for a D&C.

## 2015-08-06 NOTE — Telephone Encounter (Signed)
Discussed patient's US findings and need for a D&C per Dr. Fermin Schwab.  Verbalizing understanding.  Stating she will check with her work and call back to reschedule.

## 2015-08-07 ENCOUNTER — Encounter (HOSPITAL_BASED_OUTPATIENT_CLINIC_OR_DEPARTMENT_OTHER): Admission: RE | Payer: Self-pay | Source: Ambulatory Visit

## 2015-08-07 ENCOUNTER — Ambulatory Visit (HOSPITAL_BASED_OUTPATIENT_CLINIC_OR_DEPARTMENT_OTHER): Admission: RE | Admit: 2015-08-07 | Payer: Medicaid Other | Source: Ambulatory Visit | Admitting: Gynecologic Oncology

## 2015-08-07 SURGERY — DILATION AND CURETTAGE
Anesthesia: General

## 2015-09-10 ENCOUNTER — Telehealth: Payer: Self-pay

## 2015-09-10 NOTE — Telephone Encounter (Signed)
Orders received from [place], APNP to contact the patient to update with date and time of surgery ,but a follow up appointment with Dr [name] is mandatory . Follow up scheduled for Aug 3,2017 at 11:45 AM and the patient D&C was scheduled for Aug 10 , 2017 per patient's request. Patient states understanding , denies further questions at this time.

## 2015-09-16 ENCOUNTER — Telehealth: Payer: Self-pay | Admitting: *Deleted

## 2015-09-16 NOTE — Telephone Encounter (Signed)
"  I need to know if my appointment is tomorrow and what time?" Advised [facility] GYN appointment is tomorrow 09-17-2015 at 9:15 am.  Denies any further questions at this time.

## 2015-09-17 ENCOUNTER — Ambulatory Visit: Payer: 59 | Attending: Gynecologic Oncology | Admitting: Gynecologic Oncology

## 2015-09-17 ENCOUNTER — Encounter: Payer: Self-pay | Admitting: Gynecology

## 2015-09-17 ENCOUNTER — Encounter: Payer: Self-pay | Admitting: Gynecologic Oncology

## 2015-09-17 DIAGNOSIS — Z8249 Family history of ischemic heart disease and other diseases of the circulatory system: Secondary | ICD-10-CM | POA: Insufficient documentation

## 2015-09-17 DIAGNOSIS — J45909 Unspecified asthma, uncomplicated: Secondary | ICD-10-CM | POA: Insufficient documentation

## 2015-09-17 DIAGNOSIS — N950 Postmenopausal bleeding: Secondary | ICD-10-CM | POA: Insufficient documentation

## 2015-09-17 DIAGNOSIS — F17200 Nicotine dependence, unspecified, uncomplicated: Secondary | ICD-10-CM | POA: Insufficient documentation

## 2015-09-17 DIAGNOSIS — D259 Leiomyoma of uterus, unspecified: Secondary | ICD-10-CM | POA: Insufficient documentation

## 2015-09-17 DIAGNOSIS — Z833 Family history of diabetes mellitus: Secondary | ICD-10-CM | POA: Insufficient documentation

## 2015-09-17 DIAGNOSIS — Z809 Family history of malignant neoplasm, unspecified: Secondary | ICD-10-CM | POA: Insufficient documentation

## 2015-09-17 NOTE — Progress Notes (Signed)
GYNECOLOGIC ONCOLOGY PREOPERATIVE VISIT  HISTORY OF PRESENT ILLNESS: [name] is a 55 y.o. woman who presents today for a preoperative visit.  She is scheduled for D&C on 8/10 with Dr. [name].  [name] initially presented with PMB. She was unable to undergo office sampling and presents today for D&C preop. She underwent a TVUS on 6/5 with inability to assess her endometrial stripe due to fibroid distortion.  She reports continued cramping and bleeding. She endorses METs >4. She does have albuterol and intermittent use for asthma but no active cardiopulmonary complaint.  PAST MEDICAL HISTORY: Past Medical History:  Diagnosis Date  . Asthma     PAST SURGICAL HISTORY: Past Surgical History:  Procedure Laterality Date  . VULVECTOMY     for vulvar cancer.     OB/GYN HISTORY: OB History  No data available    MEDICATIONS:  Current Outpatient Prescriptions:  .  albuterol (PROVENTIL HFA;VENTOLIN HFA) 108 (90 Base) MCG/ACT inhaler, Inhale 2 puffs into the lungs every 6 (six) hours as needed for wheezing or shortness of breath., Disp: 18 g, Rfl: 0 .  ibuprofen (ADVIL,MOTRIN) 200 MG tablet, Take 200 mg by mouth every 6 (six) hours as needed for cramping., Disp: , Rfl:   ALLERGIES: No Known Allergies  FAMILY HISTORY: Family History  Problem Relation Age of Onset  . Hypertension Mother   . Heart attack Father   . Diabetes Maternal Grandmother   . Cancer Maternal Aunt     SOCIAL HISTORY: Social History   Social History  . Marital status: Single    Spouse name: N/A  . Number of children: N/A  . Years of education: N/A   Occupational History  . Not on file.   Social History Main Topics  . Smoking status: Current Every Day Smoker    Packs/day: 0.50  . Smokeless tobacco: Not on file  . Alcohol use 1.2 oz/week    2 Standard drinks or equivalent per week  . Drug use: No  . Sexual activity: Yes   Other Topics Concern  . Not on file   Social History  Narrative   Works as a Printmaker    REVIEW OF SYSTEMS: Complete 10-system review is negative except as in the HPI above.  PHYSICAL EXAM: BP 123/73 (Patient Position: Sitting)   Pulse 66   Temp 98.6 F (37 C)   Resp 18   Ht 5\' 1"  (1.549 m)   Wt 160 lb 9.6 oz (72.8 kg)   SpO2 100%   BMI 30.35 kg/m  General: Alert, oriented, no acute distress. HEENT: Normocephalic, atraumatic.  Sclera anicteric, posterior oropharynx clear.  Normal dentition. Chest: Clear to auscultation bilaterally. Cardiovascular: Regular rate and rhythm, no murmurs, rubs, or gallops. Abdomen: Soft, nondistended, nontender to palpation.   Extremities: Grossly normal range of motion.  Warm, well perfused.  No edema bilaterally. Skin: No rashes or lesions.  LABORATORY AND RADIOLOGIC DATA: ULTRASOUND 6/5: FINDINGS: Uterus: Measurements: 11.7 x 7.3 x 6.8 cm. Posterior fundal fibroid measures 6.6 cm. Heterogeneous echotexture throughout the uterus.  Endometrium Thickness: Not visualized due to the large fibroid and heterogeneous appearance of the uterus.  Right ovary measurements: 4.6 x 3.6 x 3.6 cm. 3.7 cm simple appearing cyst. No internal blood flow, septations or mural nodularity.  Left ovary measurements: Not visualized. Normal appearance/no adnexal mass.  Other findings: No abnormal free fluid.  IMPRESSION: Enlarged, heterogeneous uterus with 6.6 cm posterior fundal fibroid. Endometrium cannot be visualized due to the  fibroid and heterogeneous echotexture. In the setting of post-menopausal bleeding, endometrial sampling is indicated to exclude carcinoma. If results are benign, sonohysterogram should be considered for focal lesion work-up.  ASSESSMENT: [name] is a 55 y.o. woman with PMB and fibroids with inability to sample in the office.  PLAN: EUA with D&C on 8/10 with Dr. [name].  We reviewed the planned surgery in detail.  The risks of surgery were discussed  in detail and she understands these to including but not limited to bleeding, blood transfusion, infection, uterine perforation with potential injury to adjacent organs, anesthesia risk, and thromboembolic events. We discussed uterine perforation and potential need for additional surgery if this were to occur. We discussed the potential need for cervical biopsies if an abnormality was seen as her cervix was poorly visualized in the office. She voiced a clear understanding. She had the opportunity to ask questions and written informed consent was obtained today. She wishes to proceed.  The patient reports METs >4 and will not require further clearance.  Pre-operative and post-operative instructions and expectations were reviewed with the patient in detail. All her questions were answered to her satisfaction.  Marcello Fennel. Carlis Abbott, MD

## 2015-09-17 NOTE — Progress Notes (Signed)
Pt came in to inquire about financial assistance because she had family planning Medicaid.  I informed her since she's self-pay she will automatically receive a 55% discount but could possible get approved for more if she qualifies.  I gave her an application to apply for a discount thru the hospital and advised to call her case worker to see if she can apply for adult Medicaid.  She verbalized understanding.

## 2015-09-17 NOTE — Patient Instructions (Signed)
Plan to have a dilation and curettage of the uterus with Dr. [name] at the [facility].  You will receive a phone call from the pre-surgical RN one to two days before your procedure.  Please call for any questions or concerns.  Dilation and Curettage or Vacuum Curettage Dilation and curettage (D&C) and vacuum curettage are minor procedures. A D&C involves stretching (dilation) the cervix and scraping (curettage) the inside lining of the womb (uterus). During a D&C, tissue is gently scraped from the inside lining of the uterus. During a vacuum curettage, the lining and tissue in the uterus are removed with the use of gentle suction.  Curettage may be performed to either diagnose or treat a problem. As a diagnostic procedure, curettage is performed to examine tissues from the uterus. A diagnostic curettage may be performed for the following symptoms:   Irregular bleeding in the uterus.   Bleeding with the development of clots.   Spotting between menstrual periods.   Prolonged menstrual periods.   Bleeding after menopause.   No menstrual period (amenorrhea).   A change in size and shape of the uterus.  As a treatment procedure, curettage may be performed for the following reasons:   Removal of an IUD (intrauterine device).   Removal of retained placenta after giving birth. Retained placenta can cause an infection or bleeding severe enough to require transfusions.   Abortion.   Miscarriage.   Removal of polyps inside the uterus.   Removal of uncommon types of noncancerous lumps (fibroids).  LET [facility] CARE PROVIDER KNOW ABOUT:   Any allergies you have.   All medicines you are taking, including vitamins, herbs, eye drops, creams, and over-the-counter medicines.   Previous problems you or members of your family have had with the use of anesthetics.   Any blood disorders you have.   Previous surgeries you have had.    Medical conditions you have. RISKS AND COMPLICATIONS  Generally, this is a safe procedure. However, as with any procedure, complications can occur. Possible complications include:  Excessive bleeding.   Infection of the uterus.   Damage to the cervix.   Development of scar tissue (adhesions) inside the uterus, later causing abnormal amounts of menstrual bleeding.   Complications from the general anesthetic, if a general anesthetic is used.   Putting a hole (perforation) in the uterus. This is rare.  BEFORE THE PROCEDURE   Eat and drink before the procedure only as directed by your health care provider.   Arrange for someone to take you home.  PROCEDURE  This procedure usually takes about 15-30 minutes.  You will be given one of the following:  A medicine that numbs the area in and around the cervix (local anesthetic).   A medicine to make you sleep through the procedure (general anesthetic).  You will lie on your back with your legs in stirrups.   A warm metal or plastic instrument (speculum) will be placed in your vagina to keep it open and to allow the health care provider to see the cervix.  There are two ways in which your cervix can be softened and dilated. These include:   Taking a medicine.   Having thin rods (laminaria) inserted into your cervix.   A curved tool (curette) will be used to scrape cells from the inside lining of the uterus. In some cases, gentle suction is applied with the curette. The curette will then be removed.  AFTER THE PROCEDURE   You  will rest in the recovery area until you are stable and are ready to go home.   You may feel sick to your stomach (nauseous) or throw up (vomit) if you were given a general anesthetic.   You may have a sore throat if a tube was placed in your throat during general anesthesia.   You may have light cramping and bleeding. This may last for 2 days to 2 weeks after the procedure.   Your  uterus needs to make a new lining after the procedure. This may make your next period late.   This information is not intended to replace advice given to you by your health care provider. Make sure you discuss any questions you have with your health care provider.   Document Released: 02/01/2005 Document Revised: 10/04/2012 Document Reviewed: 08/31/2012 Elsevier Interactive Patient Education [employer].

## 2015-09-18 ENCOUNTER — Encounter (HOSPITAL_BASED_OUTPATIENT_CLINIC_OR_DEPARTMENT_OTHER): Payer: Self-pay | Admitting: *Deleted

## 2015-09-18 ENCOUNTER — Ambulatory Visit: Payer: Medicaid Other | Admitting: Gynecologic Oncology

## 2015-09-18 NOTE — Progress Notes (Signed)
NPO AFTER MN.  ARRIVE AT [id number].  NEEDS HG.  MAY TAKE TYLENOL IF NEEDED AM DOS W/ SIPS OF WATER .  WILL BRING RESCUE INHALER.

## 2015-09-24 NOTE — Anesthesia Preprocedure Evaluation (Addendum)
Anesthesia Evaluation  Patient identified by MRN, date of birth, ID band  Reviewed: Allergy & Precautions  Airway Mallampati: I  TM Distance: >3 FB Neck ROM: Full    Dental no notable dental hx.    Pulmonary asthma , Current Smoker,    Pulmonary exam normal        Cardiovascular negative cardio ROS Normal cardiovascular exam     Neuro/Psych negative neurological ROS  negative psych ROS   GI/Hepatic negative GI ROS, Neg liver ROS,   Endo/Other  negative endocrine ROS  Renal/GU negative Renal ROS   Hx of vulvar cancer Current post-menopausal bleeding    Musculoskeletal negative musculoskeletal ROS (+)   Abdominal   Peds negative pediatric ROS (+)  Hematology negative hematology ROS (+)   Anesthesia Other Findings   Reproductive/Obstetrics                            Anesthesia Physical Anesthesia Plan  ASA: I  Anesthesia Plan: General   Post-op Pain Management:    Induction: Intravenous  Airway Management Planned: LMA  Additional Equipment: None  Intra-op Plan:   Post-operative Plan: Extubation in OR  Informed Consent: I have reviewed the patients History and Physical, chart, labs and discussed the procedure including the risks, benefits and alternatives for the proposed anesthesia with the patient or authorized representative who has indicated his/her understanding and acceptance.   Dental advisory given  Plan Discussed with: CRNA and Surgeon  Anesthesia Plan Comments:         Anesthesia Quick Evaluation

## 2015-09-25 ENCOUNTER — Ambulatory Visit (HOSPITAL_BASED_OUTPATIENT_CLINIC_OR_DEPARTMENT_OTHER): Payer: 59 | Admitting: Anesthesiology

## 2015-09-25 ENCOUNTER — Encounter (HOSPITAL_BASED_OUTPATIENT_CLINIC_OR_DEPARTMENT_OTHER)
Admission: RE | Disposition: A | Discharge status: Home / Self Care | Payer: Self-pay | Source: Ambulatory Visit | Attending: Gynecologic Oncology

## 2015-09-25 ENCOUNTER — Encounter (HOSPITAL_BASED_OUTPATIENT_CLINIC_OR_DEPARTMENT_OTHER): Payer: Self-pay | Admitting: *Deleted

## 2015-09-25 ENCOUNTER — Ambulatory Visit (HOSPITAL_BASED_OUTPATIENT_CLINIC_OR_DEPARTMENT_OTHER)
Admission: RE | Admit: 2015-09-25 | Discharge: 2015-09-25 | Disposition: A | Discharge status: Home / Self Care | Payer: 59 | Source: Ambulatory Visit | Attending: Gynecologic Oncology | Admitting: Gynecologic Oncology

## 2015-09-25 DIAGNOSIS — N858 Other specified noninflammatory disorders of uterus: Secondary | ICD-10-CM | POA: Insufficient documentation

## 2015-09-25 DIAGNOSIS — F17210 Nicotine dependence, cigarettes, uncomplicated: Secondary | ICD-10-CM | POA: Diagnosis not present

## 2015-09-25 DIAGNOSIS — J45909 Unspecified asthma, uncomplicated: Secondary | ICD-10-CM | POA: Insufficient documentation

## 2015-09-25 DIAGNOSIS — N939 Abnormal uterine and vaginal bleeding, unspecified: Secondary | ICD-10-CM | POA: Diagnosis not present

## 2015-09-25 DIAGNOSIS — Z8544 Personal history of malignant neoplasm of other female genital organs: Secondary | ICD-10-CM | POA: Insufficient documentation

## 2015-09-25 DIAGNOSIS — D259 Leiomyoma of uterus, unspecified: Secondary | ICD-10-CM | POA: Insufficient documentation

## 2015-09-25 HISTORY — PX: DILATION AND CURETTAGE OF UTERUS: SHX78

## 2015-09-25 HISTORY — DX: Personal history of malignant neoplasm of cervix uteri: Z85.41

## 2015-09-25 HISTORY — DX: Personal history of diseases of the skin and subcutaneous tissue: Z87.2

## 2015-09-25 HISTORY — DX: Personal history of peptic ulcer disease: Z87.11

## 2015-09-25 HISTORY — DX: Congenital malformation of uterus and cervix, unspecified: Q51.9

## 2015-09-25 HISTORY — DX: Pelvic and perineal pain: R10.2

## 2015-09-25 HISTORY — DX: Personal history of malignant neoplasm of other female genital organs: Z85.44

## 2015-09-25 HISTORY — DX: Postmenopausal bleeding: N95.0

## 2015-09-25 LAB — POCT HEMOGLOBIN-HEMACUE: HEMOGLOBIN: 12.2 g/dL (ref 12.0–15.0)

## 2015-09-25 SURGERY — DILATION AND CURETTAGE
Anesthesia: General | Site: Vagina

## 2015-09-25 MED ORDER — HYDROCODONE-ACETAMINOPHEN 7.5-325 MG PO TABS
ORAL_TABLET | ORAL | Status: AC
  Filled 2015-09-25: qty 1

## 2015-09-25 MED ORDER — PROPOFOL 10 MG/ML IV BOLUS
INTRAVENOUS | Status: DC | PRN
  Administered 2015-09-25: 160 mg via INTRAVENOUS

## 2015-09-25 MED ORDER — DEXAMETHASONE SODIUM PHOSPHATE 10 MG/ML IJ SOLN
INTRAMUSCULAR | Status: AC
  Filled 2015-09-25: qty 1

## 2015-09-25 MED ORDER — FENTANYL CITRATE (PF) 100 MCG/2ML IJ SOLN
INTRAMUSCULAR | Status: AC
  Filled 2015-09-25: qty 2

## 2015-09-25 MED ORDER — FENTANYL CITRATE (PF) 100 MCG/2ML IJ SOLN
INTRAMUSCULAR | Status: DC | PRN
  Administered 2015-09-25 (×4): 25 ug via INTRAVENOUS

## 2015-09-25 MED ORDER — PROPOFOL 10 MG/ML IV BOLUS
INTRAVENOUS | Status: AC
  Filled 2015-09-25: qty 20

## 2015-09-25 MED ORDER — MIDAZOLAM HCL 2 MG/2ML IJ SOLN
INTRAMUSCULAR | Status: AC
  Filled 2015-09-25: qty 2

## 2015-09-25 MED ORDER — FENTANYL CITRATE (PF) 100 MCG/2ML IJ SOLN
25.0000 ug | INTRAMUSCULAR | Status: DC | PRN
  Filled 2015-09-25: qty 1

## 2015-09-25 MED ORDER — PROMETHAZINE HCL 25 MG/ML IJ SOLN
6.2500 mg | INTRAMUSCULAR | Status: DC | PRN
  Filled 2015-09-25: qty 1

## 2015-09-25 MED ORDER — ONDANSETRON HCL 4 MG/2ML IJ SOLN
INTRAMUSCULAR | Status: AC
  Filled 2015-09-25: qty 2

## 2015-09-25 MED ORDER — KETOROLAC TROMETHAMINE 30 MG/ML IJ SOLN
INTRAMUSCULAR | Status: DC | PRN
  Administered 2015-09-25: 30 mg via INTRAVENOUS

## 2015-09-25 MED ORDER — MIDAZOLAM HCL 5 MG/5ML IJ SOLN
INTRAMUSCULAR | Status: DC | PRN
  Administered 2015-09-25: 2 mg via INTRAVENOUS

## 2015-09-25 MED ORDER — LIDOCAINE-EPINEPHRINE (PF) 1 %-1:200000 IJ SOLN
INTRAMUSCULAR | Status: DC | PRN
  Administered 2015-09-25: 10 mL

## 2015-09-25 MED ORDER — LACTATED RINGERS IV SOLN
INTRAVENOUS | Status: DC
  Administered 2015-09-25: 10:00:00 via INTRAVENOUS
  Filled 2015-09-25: qty 1000

## 2015-09-25 MED ORDER — LIDOCAINE HCL (CARDIAC) 20 MG/ML IV SOLN
INTRAVENOUS | Status: AC
  Filled 2015-09-25: qty 5

## 2015-09-25 MED ORDER — LIDOCAINE HCL (CARDIAC) 20 MG/ML IV SOLN
INTRAVENOUS | Status: DC | PRN
  Administered 2015-09-25: 80 mg via INTRAVENOUS

## 2015-09-25 MED ORDER — ONDANSETRON HCL 4 MG/2ML IJ SOLN
INTRAMUSCULAR | Status: DC | PRN
  Administered 2015-09-25: 4 mg via INTRAVENOUS

## 2015-09-25 MED ORDER — DEXAMETHASONE SODIUM PHOSPHATE 4 MG/ML IJ SOLN
INTRAMUSCULAR | Status: DC | PRN
  Administered 2015-09-25: 10 mg via INTRAVENOUS

## 2015-09-25 MED ORDER — HYDROCODONE-ACETAMINOPHEN 7.5-325 MG PO TABS
1.0000 | ORAL_TABLET | Freq: Once | ORAL | Status: AC | PRN
  Administered 2015-09-25: 1 via ORAL
  Filled 2015-09-25: qty 1

## 2015-09-25 SURGICAL SUPPLY — 25 items
BAG URINE DRAINAGE (UROLOGICAL SUPPLIES) IMPLANT
CATH FOLEY 2WAY SLVR 30CC 16FR (CATHETERS) IMPLANT
CATH ROBINSON RED A/P 16FR (CATHETERS) ×3 IMPLANT
DRAPE UNDERBUTTOCKS STRL (DRAPE) ×3 IMPLANT
DRSG TELFA 3X8 NADH (GAUZE/BANDAGES/DRESSINGS) ×3 IMPLANT
GLOVE BIO SURGEON STRL SZ 6.5 (GLOVE) ×2 IMPLANT
GLOVE BIO SURGEONS STRL SZ 6.5 (GLOVE) ×1
GLOVE BIOGEL PI IND STRL 6.5 (GLOVE) ×1 IMPLANT
GLOVE BIOGEL PI INDICATOR 6.5 (GLOVE) ×2
GLOVE ECLIPSE 7.5 STRL STRAW (GLOVE) ×3 IMPLANT
GOWN STRL REUS W/ TWL LRG LVL3 (GOWN DISPOSABLE) ×1 IMPLANT
GOWN STRL REUS W/TWL LRG LVL3 (GOWN DISPOSABLE) ×6 IMPLANT
KIT ROOM TURNOVER WOR (KITS) ×3 IMPLANT
MANIFOLD NEPTUNE II (INSTRUMENTS) IMPLANT
NEEDLE SPNL 18GX3.5 QUINCKE PK (NEEDLE) IMPLANT
NS IRRIG 500ML POUR BTL (IV SOLUTION) ×3 IMPLANT
PACK BASIN DAY SURGERY FS (CUSTOM PROCEDURE TRAY) ×3 IMPLANT
PAD OB MATERNITY 4.3X12.25 (PERSONAL CARE ITEMS) ×3 IMPLANT
PAD PREP 24X48 CUFFED NSTRL (MISCELLANEOUS) ×3 IMPLANT
SCOPETTES 8  STERILE (MISCELLANEOUS)
SCOPETTES 8 STERILE (MISCELLANEOUS) IMPLANT
TOWEL OR 17X24 6PK STRL BLUE (TOWEL DISPOSABLE) ×6 IMPLANT
TRAY DSU PREP LF (CUSTOM PROCEDURE TRAY) ×3 IMPLANT
TUBE CONNECTING 12'X1/4 (SUCTIONS)
TUBE CONNECTING 12X1/4 (SUCTIONS) IMPLANT

## 2015-09-25 NOTE — Op Note (Signed)
Preoperative Diagnosis:  Abnormal uterine bleeding, fibroid uterus, unable to sample in the office  Postoperative Diagnosis: Abnormal uterine bleeding, fibroid uterus, unable to sample in the office  Procedure(s) Performed: Endocervical curettage, endometrial curettage  Surgeon: Francetta Found.  Skeet Latch, M.D. PhD  Anesthesia: LMA, Cervical block with 1% lidocaine with epi  Indication for Procedure: 55 y.o.  with posterior myoma that limited evaluation of the uterine cavity.  Operative Findings:Posterior myoma, uterus sounded to 6 cm.  Procedure:[name] was taken to the OR and timeout performed.  She was then placed under LMA anesthesia and her feet placed in the dorsal lithotomy position.    [name] was prepped and draped in the usual sterile fashion.   A second time out was performed. The bladder was drained.  The cervical block was applied in the standard fashion.  The uterus was sounded and the cervix dilated.  ECC was collected.  Endometrial curettings were collected without difficulty.    Specimens: Endocervical curet tings, endometrial curetting.  Estimated Blood Loss:min mL. Blood Replacement:none  Urine Output: 100cc  Sponge, lap and needle counts were correct x 3.    Complications:None  The patient had sequential compression devices for VTE prophylaxis          Disposition: PACU - hemodynamically stable.         Condition: stable

## 2015-09-25 NOTE — Anesthesia Procedure Notes (Signed)
Procedure Name: LMA Insertion Date/Time: 09/25/2015 11:05 AM Performed by: Justice Rocher Pre-anesthesia Checklist: Patient identified, Emergency Drugs available, Suction available and Patient being monitored Patient Re-evaluated:Patient Re-evaluated prior to inductionOxygen Delivery Method: Circle system utilized Preoxygenation: Pre-oxygenation with 100% oxygen Intubation Type: IV induction Ventilation: Mask ventilation without difficulty LMA: LMA inserted LMA Size: 4.0 Number of attempts: 1 Airway Equipment and Method: Bite block Placement Confirmation: positive ETCO2 Tube secured with: Tape Dental Injury: Teeth and Oropharynx as per pre-operative assessment

## 2015-09-25 NOTE — Interval H&P Note (Signed)
History and Physical Interval Note:  09/25/2015 10:50 AM  [name]  has presented today for surgery, with the diagnosis of post menopausal bleeding  The various methods of treatment have been discussed with the patient and family. After consideration of risks, benefits and other options for treatment, the patient has consented to  Procedure(s): DILATATION AND CURETTAGE (N/A) as a surgical intervention .  The patient's history has been reviewed, patient examined, no change in status, stable for surgery.  I have reviewed the patient's chart and labs.  Questions were answered to the patient's satisfaction.     [place], [facility]

## 2015-09-25 NOTE — Transfer of Care (Signed)
Immediate Anesthesia Transfer of Care Note  Patient: [name]  Procedure(s) Performed: Procedure(s) (LRB): DILATATION AND CURETTAGE (N/A)  Patient Location: PACU  Anesthesia Type: General  Level of Consciousness: awake, sedated, patient cooperative and responds to stimulation  Airway & Oxygen Therapy: Patient Spontanous Breathing and Patient connected to [place] oxygen  Post-op Assessment: Report given to PACU RN, Post -op Vital signs reviewed and stable and Patient moving all extremities  Post vital signs: Reviewed and stable  Complications: No apparent anesthesia complications

## 2015-09-25 NOTE — Anesthesia Postprocedure Evaluation (Signed)
Anesthesia Post Note  Patient: [name]  Procedure(s) Performed: Procedure(s) (LRB): DILATATION AND CURETTAGE (N/A)  Patient location during evaluation: PACU Anesthesia Type: General Level of consciousness: awake and alert Pain management: pain level controlled Vital Signs Assessment: post-procedure vital signs reviewed and stable Respiratory status: spontaneous breathing, nonlabored ventilation, respiratory function stable and patient connected to nasal cannula oxygen Cardiovascular status: blood pressure returned to baseline and stable Postop Assessment: no signs of nausea or vomiting Anesthetic complications: no    Last Vitals:  Vitals:   09/25/15 1145 09/25/15 1200  BP: 112/72 109/86  Pulse: 65 67  Resp: 11 13  Temp:      Last Pain:  Vitals:   09/25/15 1200  TempSrc:   PainSc: 0-No pain                 Reginal Lutes

## 2015-09-25 NOTE — Discharge Instructions (Signed)

## 2015-09-25 NOTE — H&P (View-Only) (Signed)
GYNECOLOGIC ONCOLOGY PREOPERATIVE VISIT  HISTORY OF PRESENT ILLNESS: [name] is a 55 y.o. woman who presents today for a preoperative visit.  She is scheduled for D&C on 8/10 with Dr. [name].  [name] initially presented with PMB. She was unable to undergo office sampling and presents today for D&C preop. She underwent a TVUS on 6/5 with inability to assess her endometrial stripe due to fibroid distortion.  She reports continued cramping and bleeding. She endorses METs >4. She does have albuterol and intermittent use for asthma but no active cardiopulmonary complaint.  PAST MEDICAL HISTORY: Past Medical History:  Diagnosis Date  . Asthma     PAST SURGICAL HISTORY: Past Surgical History:  Procedure Laterality Date  . VULVECTOMY     for vulvar cancer.     OB/GYN HISTORY: OB History  No data available    MEDICATIONS:  Current Outpatient Prescriptions:  .  albuterol (PROVENTIL HFA;VENTOLIN HFA) 108 (90 Base) MCG/ACT inhaler, Inhale 2 puffs into the lungs every 6 (six) hours as needed for wheezing or shortness of breath., Disp: 18 g, Rfl: 0 .  ibuprofen (ADVIL,MOTRIN) 200 MG tablet, Take 200 mg by mouth every 6 (six) hours as needed for cramping., Disp: , Rfl:   ALLERGIES: No Known Allergies  FAMILY HISTORY: Family History  Problem Relation Age of Onset  . Hypertension Mother   . Heart attack Father   . Diabetes Maternal Grandmother   . Cancer Maternal Aunt     SOCIAL HISTORY: Social History   Social History  . Marital status: Single    Spouse name: N/A  . Number of children: N/A  . Years of education: N/A   Occupational History  . Not on file.   Social History Main Topics  . Smoking status: Current Every Day Smoker    Packs/day: 0.50  . Smokeless tobacco: Not on file  . Alcohol use 1.2 oz/week    2 Standard drinks or equivalent per week  . Drug use: No  . Sexual activity: Yes   Other Topics Concern  . Not on file   Social History  Narrative   Works as a Printmaker    REVIEW OF SYSTEMS: Complete 10-system review is negative except as in the HPI above.  PHYSICAL EXAM: BP 123/73 (Patient Position: Sitting)   Pulse 66   Temp 98.6 F (37 C)   Resp 18   Ht 5\' 1"  (1.549 m)   Wt 160 lb 9.6 oz (72.8 kg)   SpO2 100%   BMI 30.35 kg/m  General: Alert, oriented, no acute distress. HEENT: Normocephalic, atraumatic.  Sclera anicteric, posterior oropharynx clear.  Normal dentition. Chest: Clear to auscultation bilaterally. Cardiovascular: Regular rate and rhythm, no murmurs, rubs, or gallops. Abdomen: Soft, nondistended, nontender to palpation.   Extremities: Grossly normal range of motion.  Warm, well perfused.  No edema bilaterally. Skin: No rashes or lesions.  LABORATORY AND RADIOLOGIC DATA: ULTRASOUND 6/5: FINDINGS: Uterus: Measurements: 11.7 x 7.3 x 6.8 cm. Posterior fundal fibroid measures 6.6 cm. Heterogeneous echotexture throughout the uterus.  Endometrium Thickness: Not visualized due to the large fibroid and heterogeneous appearance of the uterus.  Right ovary measurements: 4.6 x 3.6 x 3.6 cm. 3.7 cm simple appearing cyst. No internal blood flow, septations or mural nodularity.  Left ovary measurements: Not visualized. Normal appearance/no adnexal mass.  Other findings: No abnormal free fluid.  IMPRESSION: Enlarged, heterogeneous uterus with 6.6 cm posterior fundal fibroid. Endometrium cannot be visualized due to the  fibroid and heterogeneous echotexture. In the setting of post-menopausal bleeding, endometrial sampling is indicated to exclude carcinoma. If results are benign, sonohysterogram should be considered for focal lesion work-up.  ASSESSMENT: [name] is a 55 y.o. woman with PMB and fibroids with inability to sample in the office.  PLAN: EUA with D&C on 8/10 with Dr. [name].  We reviewed the planned surgery in detail.  The risks of surgery were discussed  in detail and she understands these to including but not limited to bleeding, blood transfusion, infection, uterine perforation with potential injury to adjacent organs, anesthesia risk, and thromboembolic events. We discussed uterine perforation and potential need for additional surgery if this were to occur. We discussed the potential need for cervical biopsies if an abnormality was seen as her cervix was poorly visualized in the office. She voiced a clear understanding. She had the opportunity to ask questions and written informed consent was obtained today. She wishes to proceed.  The patient reports METs >4 and will not require further clearance.  Pre-operative and post-operative instructions and expectations were reviewed with the patient in detail. All her questions were answered to her satisfaction.  Marcello Fennel. Carlis Abbott, MD

## 2015-09-26 ENCOUNTER — Encounter (HOSPITAL_BASED_OUTPATIENT_CLINIC_OR_DEPARTMENT_OTHER): Payer: Self-pay | Admitting: Gynecologic Oncology

## 2015-10-01 NOTE — Addendum Note (Signed)
Addendum  created 10/01/15 [id number] by Reginal Lutes, MD   Anesthesia Staff edited

## 2015-10-02 ENCOUNTER — Telehealth: Payer: Self-pay

## 2015-10-02 NOTE — Telephone Encounter (Signed)
Orders received from [facility] to contact the patient to update with surgical pathology report . Findings "no cancer or abnormal cells seen". Patient can follow up with PCP and with Dr [name] as needed . Patient contacted and updated , patient states understanding , denies further questions at this time.

## 2015-10-07 ENCOUNTER — Telehealth: Payer: Self-pay | Admitting: Internal Medicine

## 2015-10-07 NOTE — Telephone Encounter (Signed)
  Reason for call:   I placed an outgoing call to [name], mother of [name], who I follow in clinic around 10am regarding her mother's recent labwork. Her daughter [name] picked up.    Assessment/ Plan:  She also asked me questions regarding her personal medical history given that she recently underwent resection of fibroids by her gynecologist at [facility] and was unsure if she needed a follow-up with her PCP who she first mistook as myself but then correctly identified them as Dr. Genene Churn. I told her I would route a message to him to get in touch with her to which she acknowledged appreciation.   Riccardo Dubin, MD   10/07/2015, 10:21 AM

## 2015-10-17 NOTE — Telephone Encounter (Signed)
Called patient. Went over the biopsy results again from recent D&C. It was benign.   She is feeling fine. No complaints currently.  I asked her to follow up with me in non-urgent basis whenever I have next available appt.  She will call the clinic.

## 2015-12-15 ENCOUNTER — Encounter: Payer: Medicaid Other | Admitting: Internal Medicine

## 2016-01-19 ENCOUNTER — Ambulatory Visit (INDEPENDENT_AMBULATORY_CARE_PROVIDER_SITE_OTHER): Payer: Self-pay | Admitting: Internal Medicine

## 2016-01-19 ENCOUNTER — Encounter: Payer: Self-pay | Admitting: Internal Medicine

## 2016-01-19 ENCOUNTER — Encounter (INDEPENDENT_AMBULATORY_CARE_PROVIDER_SITE_OTHER): Payer: Self-pay

## 2016-01-19 VITALS — BP 137/83 | HR 75 | Temp 98.2°F | Ht 61.0 in | Wt 161.0 lb

## 2016-01-19 DIAGNOSIS — Z79899 Other long term (current) drug therapy: Secondary | ICD-10-CM

## 2016-01-19 DIAGNOSIS — F17210 Nicotine dependence, cigarettes, uncomplicated: Secondary | ICD-10-CM

## 2016-01-19 DIAGNOSIS — R232 Flushing: Secondary | ICD-10-CM

## 2016-01-19 DIAGNOSIS — J4540 Moderate persistent asthma, uncomplicated: Secondary | ICD-10-CM

## 2016-01-19 DIAGNOSIS — G8929 Other chronic pain: Secondary | ICD-10-CM

## 2016-01-19 DIAGNOSIS — Z0000 Encounter for general adult medical examination without abnormal findings: Secondary | ICD-10-CM

## 2016-01-19 DIAGNOSIS — M25512 Pain in left shoulder: Secondary | ICD-10-CM

## 2016-01-19 DIAGNOSIS — N951 Menopausal and female climacteric states: Secondary | ICD-10-CM

## 2016-01-19 DIAGNOSIS — J45909 Unspecified asthma, uncomplicated: Secondary | ICD-10-CM

## 2016-01-19 MED ORDER — ALBUTEROL SULFATE HFA 108 (90 BASE) MCG/ACT IN AERS: 2.0000 | INHALATION_SPRAY | Freq: Four times a day (QID) | RESPIRATORY_TRACT | 0 refills | Status: DC | PRN

## 2016-01-19 MED ORDER — VENLAFAXINE HCL ER 37.5 MG PO CP24: 37.5000 mg | ORAL_CAPSULE | Freq: Every day | ORAL | 2 refills | Status: DC

## 2016-01-19 NOTE — Assessment & Plan Note (Signed)
Using albuterol BID. Never had the PFT yet. Will get it done. Refilled albuterol. Cont OTC zyrtec. Asked to quit smoking.

## 2016-01-19 NOTE — Patient Instructions (Addendum)
Take Effexor 37.5mg  daily.  Take ibuprofen 600mg  every 4 hours as needed for pain. Take it scheduled for 1 week to help with the inflammation.   Get the xray of shoulder.  F/up in 3 months.

## 2016-01-19 NOTE — Progress Notes (Signed)
   CC: asthma follow up   HPI:  [name] is a 55 y.o. with PMH as listed below is here for follow up for her Asthma, also having hot flashes worsened since [facility] for uterine fibroid.  Past Medical History:  Diagnosis Date  . 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 Asthma    seasonal and environmental  . 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 History of cellulitis    post op 2004 vulvectomy surgery --  left thigh celluitis-- resolved  . History of cervical cancer    2002  carcinoma in situ  s/p  conization  . History of peptic ulcer    age 39's   due to drinking alcohol --  resolved when quit  . Pelvic cramping   . PMB (postmenopausal bleeding)    Asthma - on albuterol and OTC zyrtec. No PFTs done previously because of lack of insurance. Continues to smoke. Using albuterol BID.  Healthcare maintenance - had mammogram ordered.   Having left shoulder pain for over 1 year with some radiation down the arm. 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 No weakness. No falls or injuries. Is able to move left shoulder with some pain. Has some tingling on left hand.   Having hot flashes over 6 months. Worsened after D&C for uterine fibroid. It is becoming very bothersome. Also has pain with sex due to vaginal dryness.   Review of Systems:   Review of Systems  Constitutional: Negative for chills and fever.  Eyes: Negative for blurred vision and double vision.  Respiratory: Negative for cough.   Cardiovascular: Negative for chest pain and palpitations.  Musculoskeletal: Positive for joint pain.  Neurological: Positive for tingling. Negative for dizziness.     Physical  Exam:  Vitals:   01/19/16 1330  BP: 137/83  Pulse: 75  Temp: 98.2 F (36.8 C)  TempSrc: Oral  SpO2: 100%  Weight: 161 lb (73 kg)  Height: 5\' 1"  (1.549 m)   Physical Exam  Constitutional: She appears well-developed and well-nourished. No distress.  HENT:  Head: Normocephalic and atraumatic.  Eyes: Conjunctivae are normal.  Cardiovascular: Normal rate and regular rhythm.  Exam reveals no gallop and no friction rub.   No murmur heard. Respiratory: Effort normal and breath sounds normal. No respiratory distress. She has no wheezes.  Musculoskeletal:  Left shoulder appears normal without erythema or swelling. No tenderness or effusion noted over the joint. Has full ROM but painful to do extension >90 degrees. Has pain with external rotation and with impingement test.   5/5 strength bilaterally. Negative Tinnel's sign, negative Phalen sign.   Skin: She is not diaphoretic.     Performed left shoulder POC U/s Showed heterogenous change of the subscapular and supraspinatus tendons consistent with tendinopathy. Cortical irregularity of humeral head. Moderate amount of fluid seen in the subacromial bursae.    Assessment & Plan:   See Encounters Tab for problem based charting.  Patient seen with Dr. Evette Doffing

## 2016-01-19 NOTE — Assessment & Plan Note (Addendum)
Having left shoulder pain for over 1 year which is progressively getting worse. 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 Also having some pain radiating down the arm and also having some numbness of her hand.   Did POC U/s: Showed heterogenous change of the subscapular and supraspinatus tendons consistent with tendinopathy. Cortical irregularity of humeral head. Moderate amount of fluid seen in the subacromial bursae.   Will do xray of left shoulder to look at the cortical irregularly better, likely has OA changes.  Ibuprofen 600mg  q4hr for 1 week then as needed for the tendinopathy pain. Can consider steroid injection in the future.  F/up as needed.

## 2016-01-19 NOTE — Assessment & Plan Note (Addendum)
Having hot flashes over 6 months. Will try Effexor 37.5mg  daily. Can go up if needed in the future.   Asked to use lubricant for sex. If not helping, can add estrogen cream later.

## 2016-01-20 NOTE — Progress Notes (Signed)
Internal Medicine Clinic Attending  I saw and evaluated the patient.  I personally confirmed the key portions of the history and exam documented by Dr. Genene Churn and I reviewed pertinent patient test results.  The assessment, diagnosis, and plan were formulated together and I agree with the documentation in the resident's note.  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 We did a point of care ultrasound of the left shoulder in the clinic which shows heterogenous appearing subscapularis and supraspinatous tendons consistent with tendinopathy (first image below), no significant tears. I am impressed with the amount of cortical irregularity of the humeral head, she may have degenerative or inflammatory arthritis of the joint space; I agree with plain film imaging to assess. [facility] and glenohumeral joint spaces looked normal without effusion. The subacromial bursa has a moderate amount of fluid superficial to the supraspinatous (second image below), also consistent with tendinopathy. We offered her steroid injection into this bursa as a treatment option for her pain, but she is afraid of needles and chose to continue conservative NSAIDs instead.

## 2016-02-16 ENCOUNTER — Ambulatory Visit (HOSPITAL_COMMUNITY)
Admission: EM | Admit: 2016-02-16 | Discharge: 2016-02-16 | Disposition: A | Discharge status: Home / Self Care | Payer: 59 | Attending: Internal Medicine | Admitting: Internal Medicine

## 2016-02-16 ENCOUNTER — Encounter (HOSPITAL_COMMUNITY): Payer: Self-pay | Admitting: Emergency Medicine

## 2016-02-16 DIAGNOSIS — J019 Acute sinusitis, unspecified: Secondary | ICD-10-CM

## 2016-02-16 DIAGNOSIS — J209 Acute bronchitis, unspecified: Secondary | ICD-10-CM

## 2016-02-16 MED ORDER — ALBUTEROL SULFATE HFA 108 (90 BASE) MCG/ACT IN AERS: 2.0000 | INHALATION_SPRAY | RESPIRATORY_TRACT | 0 refills | Status: DC | PRN

## 2016-02-16 MED ORDER — BENZONATATE 200 MG PO CAPS: 200.0000 mg | ORAL_CAPSULE | Freq: Three times a day (TID) | ORAL | 1 refills | Status: DC | PRN

## 2016-02-16 MED ORDER — AMOXICILLIN-POT CLAVULANATE 875-125 MG PO TABS: 1.0000 | ORAL_TABLET | Freq: Two times a day (BID) | ORAL | 0 refills | Status: AC

## 2016-02-16 MED ORDER — PREDNISONE 50 MG PO TABS: 50.0000 mg | ORAL_TABLET | Freq: Every day | ORAL | 0 refills | Status: AC

## 2016-02-16 NOTE — Discharge Instructions (Addendum)
Rest and push fluids.  Recheck or followup with primary care provider for new fever >100.5, increasing phlegm production/nasal discharge, or if not starting to improve in a few days.  Anticipate gradual improvement in cough/well being over the next several days; cough may take a couple weeks to subside.

## 2016-02-16 NOTE — ED Triage Notes (Signed)
The patient presented to the [facility] with a complaint of a cough and congestion and general body aches x 3 weeks. The patient reported using OTC meds with minimal relief.

## 2016-02-16 NOTE — ED Provider Notes (Signed)
[place]    CSN: [id number] Arrival date & time: 02/16/16  [place]     History   Chief Complaint Chief Complaint  Patient presents with  . Cough    HPI [name] is a 56 y.o. female. She presents today with 3 week history of sinus congestion, runny nose, cough. Not sleeping well because of coughing. No real fever. Ribs are sore from coughing, now having some posttussive emesis. History of bronchitis, she does smoke. Little bit of chest tightness, using her albuterol inhaler. Feels tired and draggy. 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    HPI  Past Medical History:  Diagnosis Date  . 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 Asthma    seasonal and environmental  . 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 History of cellulitis    post op 2004 vulvectomy surgery --  left thigh celluitis-- resolved  . History of cervical cancer    2002  carcinoma in situ  s/p  conization  . History of peptic ulcer    age 25's   due to drinking alcohol --  resolved when quit  . Pelvic cramping   . PMB (postmenopausal bleeding)     Patient Active Problem List   Diagnosis Date Noted  . Hot flashes, menopausal 01/19/2016  . Shoulder pain, left 01/19/2016  . Postmenopausal bleeding 09/17/2015  . Encounter for cervical Pap smear with pelvic exam 05/08/2015  . Reactive airway disease 05/01/2015  . Health care maintenance 05/01/2015    Past Surgical History:  Procedure Laterality Date  . DILATION AND CURETTAGE OF UTERUS N/A 09/25/2015   Procedure: DILATATION AND CURETTAGE;  Surgeon: [name], MD;  Location: [facility];  Service: Gynecology;  Laterality: N/A;  . EUA/  COLD KNIFE CERVICAL CONIZATION/   ENDOCERVICAL CURETTAGE/  PARTIAL LEFT VULVECTOMY  04/10/2000  . LEFT INGUINAL LYMPHADENECTOMY/  LEFT MODIFIED RADICAL VULVECTOMY/  RIGHT SIMPLE VULECTOMY/  EXCISION PERIANAL LESIONS  06/26/2002       Home Medications    Prior to Admission medications   Medication Sig Start Date End Date Taking? Authorizing Provider  acetaminophen (TYLENOL) 500 MG tablet Take 1,000 mg by mouth every 6 (six) hours as needed.   Yes Historical Provider, MD  albuterol (PROVENTIL HFA;VENTOLIN HFA) 108 (90 Base) MCG/ACT inhaler Inhale 2 puffs into the lungs every 6 (six) hours as needed for wheezing or shortness of breath. 01/19/16  Yes Tasrif Ahmed, MD  ibuprofen (ADVIL,MOTRIN) 200 MG tablet Take 200 mg by mouth every 6 (six) hours as needed for cramping.   Yes Historical Provider, MD  albuterol (PROVENTIL HFA;VENTOLIN HFA) 108 (90 Base) MCG/ACT inhaler Inhale 2 puffs into the lungs every 4 (four) hours as needed for wheezing or shortness of breath. 02/16/16   [name], MD  amoxicillin-clavulanate (AUGMENTIN) 875-125 MG tablet Take 1 tablet by mouth 2 (two) times daily. 02/16/16 02/26/16  [name], MD  benzonatate (TESSALON) 200 MG capsule Take 1 capsule (200 mg total) by mouth 3 (three) times daily as needed for cough. 02/16/16   [name], MD  predniSONE (DELTASONE) 50 MG tablet Take 1 tablet (50 mg  total) by mouth daily. 02/16/16 02/21/16  [name], MD    Family History Family History  Problem Relation Age of Onset  . Hypertension Mother   . Heart attack Father   . Diabetes Maternal Grandmother   . Cancer Maternal Aunt     Social History Social History  Substance Use Topics  . Smoking status: Current Every Day Smoker    Packs/day: 0.50    Years: 32.00    Types: Cigarettes  . Smokeless tobacco: Never Used     Comment: pt is working on [employer] since 2016  . Alcohol use Yes     Comment: occasional beer     Allergies   Patient has no known allergies.   Review of Systems Review of  Systems  All other systems reviewed and are negative.    Physical Exam Triage Vital Signs ED Triage Vitals  Enc Vitals Group     BP 02/16/16 1307 116/87     Pulse Rate 02/16/16 1307 73     Resp 02/16/16 1307 18     Temp 02/16/16 1307 99.3 F (37.4 C)     Temp Source 02/16/16 1307 Oral     SpO2 02/16/16 1307 100 %     Weight --      Height --      Pain Score 02/16/16 1310 8   Updated Vital Signs BP 116/87 (BP Location: Left Arm)   Pulse 73   Temp 99.3 F (37.4 C) (Oral)   Resp 18   LMP 04/24/2015 (Approximate)   SpO2 100%  Physical Exam  Constitutional: She is oriented to person, place, and time.  Alert, nicely groomed Looks ill but not toxic Frequent coughing during exam Voice sounds very congested  HENT:  Head: Atraumatic.  Bilateral TMs are dull, red tinged Marked nasal congestion with mucopurulent material present bilaterally Throat red with postnasal drainage  Eyes:  Conjugate gaze, no eye redness/drainage  Neck: Neck supple.  Cardiovascular: Normal rate and regular rhythm.   Pulmonary/Chest: No respiratory distress.  Quite coarse but symmetric breath sounds throughout  Abdominal: She exhibits no distension.  Musculoskeletal: Normal range of motion.  No leg swelling  Neurological: She is alert and oriented to person, place, and time.  Skin: Skin is warm and dry.  No cyanosis  Nursing note and vitals reviewed.    UC Treatments / Results   Procedures Procedures (including critical care time) None today  Final Clinical Impressions(s) / UC Diagnoses   Final diagnoses:  Acute sinusitis with symptoms > 10 days  Acute bronchitis with bronchospasm   Rest and push fluids.  Recheck or followup with primary care provider for new fever >100.5, increasing phlegm production/nasal discharge, or if not starting to improve in a few days.  Anticipate gradual improvement in cough/well being over the next several days; cough may take a couple weeks to  subside.  New Prescriptions New Prescriptions   ALBUTEROL (PROVENTIL HFA;VENTOLIN HFA) 108 (90 BASE) MCG/ACT INHALER    Inhale 2 puffs into the lungs every 4 (four) hours as needed for wheezing or shortness of breath.   AMOXICILLIN-CLAVULANATE (AUGMENTIN) 875-125 MG TABLET    Take 1 tablet by mouth 2 (two) times daily.   BENZONATATE (TESSALON) 200 MG CAPSULE    Take 1 capsule (200 mg total) by mouth 3 (three) times daily as needed for cough.   PREDNISONE (DELTASONE) 50 MG TABLET    Take 1 tablet (50 mg total) by mouth daily.     Mickel Baas  Darliss Cheney, MD 02/17/16 2026

## 2016-07-23 ENCOUNTER — Encounter: Payer: Self-pay | Admitting: *Deleted

## 2016-07-31 ENCOUNTER — Encounter (HOSPITAL_COMMUNITY): Payer: Self-pay

## 2016-07-31 ENCOUNTER — Emergency Department (HOSPITAL_COMMUNITY): Payer: Medicaid Other

## 2016-07-31 DIAGNOSIS — J4541 Moderate persistent asthma with (acute) exacerbation: Secondary | ICD-10-CM | POA: Insufficient documentation

## 2016-07-31 DIAGNOSIS — F17210 Nicotine dependence, cigarettes, uncomplicated: Secondary | ICD-10-CM | POA: Insufficient documentation

## 2016-07-31 MED ORDER — ALBUTEROL SULFATE (2.5 MG/3ML) 0.083% IN NEBU
INHALATION_SOLUTION | RESPIRATORY_TRACT | Status: AC
  Filled 2016-07-31: qty 6

## 2016-07-31 MED ORDER — ALBUTEROL SULFATE (2.5 MG/3ML) 0.083% IN NEBU
5.0000 mg | INHALATION_SOLUTION | Freq: Once | RESPIRATORY_TRACT | Status: AC
  Administered 2016-07-31: 5 mg via RESPIRATORY_TRACT

## 2016-07-31 NOTE — ED Triage Notes (Signed)
Pt complaining of SOB. Pt with obvious wheezing at triage. Pt sats 100% on RA. Pt states hx of asthma, states triggered by the heat tonight.

## 2016-08-01 ENCOUNTER — Emergency Department (HOSPITAL_COMMUNITY)
Admission: EM | Admit: 2016-08-01 | Discharge: 2016-08-01 | Disposition: A | Discharge status: Home / Self Care | Payer: Medicaid Other | Attending: Emergency Medicine | Admitting: Emergency Medicine

## 2016-08-01 DIAGNOSIS — J4541 Moderate persistent asthma with (acute) exacerbation: Secondary | ICD-10-CM

## 2016-08-01 MED ORDER — IPRATROPIUM BROMIDE 0.02 % IN SOLN
0.5000 mg | Freq: Once | RESPIRATORY_TRACT | Status: AC
  Administered 2016-08-01: 0.5 mg via RESPIRATORY_TRACT
  Filled 2016-08-01: qty 2.5

## 2016-08-01 MED ORDER — ALBUTEROL SULFATE (2.5 MG/3ML) 0.083% IN NEBU
5.0000 mg | INHALATION_SOLUTION | Freq: Once | RESPIRATORY_TRACT | Status: AC
  Administered 2016-08-01: 5 mg via RESPIRATORY_TRACT
  Filled 2016-08-01: qty 6

## 2016-08-01 MED ORDER — ALBUTEROL SULFATE HFA 108 (90 BASE) MCG/ACT IN AERS: 1.0000 | INHALATION_SPRAY | Freq: Four times a day (QID) | RESPIRATORY_TRACT | 0 refills | Status: DC | PRN

## 2016-08-01 MED ORDER — PREDNISONE 50 MG PO TABS
ORAL_TABLET | ORAL | 0 refills | Status: DC
Start: 2016-08-01 — End: 2017-04-15

## 2016-08-01 MED ORDER — ALBUTEROL SULFATE (2.5 MG/3ML) 0.083% IN NEBU: 2.5000 mg | INHALATION_SOLUTION | RESPIRATORY_TRACT | 0 refills | Status: DC | PRN

## 2016-08-01 MED ORDER — PREDNISONE 20 MG PO TABS
60.0000 mg | ORAL_TABLET | Freq: Once | ORAL | Status: AC
  Administered 2016-08-01: 60 mg via ORAL
  Filled 2016-08-01: qty 3

## 2016-08-01 NOTE — ED Provider Notes (Signed)
[facility] DEPT Provider Note   CSN: [id number] Arrival date & time: 07/31/16  2326   By signing my name below, I, Eunice Blase, attest that this documentation has been prepared under the direction and in the presence of Ripley Fraise, MD. Electronically signed, Eunice Blase, ED Scribe. 08/01/16. 2:23 AM.   History   Chief Complaint Chief Complaint  Patient presents with  . Shortness of Breath   The history is provided by the patient and medical records. No language interpreter was used.  Shortness of Breath  This is a chronic problem. Associated symptoms include cough, wheezing and chest pain ("soreness"). Pertinent negatives include no fever, no sore throat, no hemoptysis, no vomiting and no abdominal pain. The problem's precipitants include weather/humidity. She has tried beta-agonist inhalers for the symptoms. Associated medical issues include asthma and chronic lung disease.    [name] is a 56 y.o. female with h/o bronchitis and asthma presenting to the Emergency Department with chief complaint of gradually worsening SOB x ~3 days. Pt c/o associated side, back, and lateral rib soreness; cough; some low grade fevers; R > L mid to low back pain; wheezing and nasal congestion. Pt described 7/10 pain in triage. No other descriptors to report. Pt allegedly out of inhaler at home. She states she administered a breathing treatment at home without relief. Pt states she is attempting to quit smoking and her asthma may be triggered by heat/ humidity fluctuations in the weather recently. No sore throat. No other complaints at this time.   Past Medical History:  Diagnosis Date  . 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 Asthma    seasonal and environmental  . 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 History of cellulitis    post op 2004 vulvectomy surgery --  left thigh celluitis-- resolved  . History of cervical cancer    2002  carcinoma in situ  s/p  conization  . History of peptic ulcer    age 12's   due to drinking alcohol --  resolved when quit  . Pelvic cramping   . PMB (postmenopausal bleeding)     Patient Active Problem List   Diagnosis Date Noted  . Hot flashes, menopausal 01/19/2016  . Shoulder pain, left 01/19/2016  . Postmenopausal bleeding 09/17/2015  . Reactive airway disease 05/01/2015    Past Surgical History:  Procedure Laterality Date  . DILATION AND CURETTAGE OF UTERUS N/A 09/25/2015   Procedure: DILATATION AND CURETTAGE;  Surgeon: [name], MD;  Location: [facility];  Service: Gynecology;  Laterality: N/A;  . EUA/  COLD KNIFE CERVICAL CONIZATION/  ENDOCERVICAL CURETTAGE/  PARTIAL LEFT VULVECTOMY  04/10/2000  . LEFT INGUINAL LYMPHADENECTOMY/  LEFT MODIFIED RADICAL VULVECTOMY/  RIGHT SIMPLE VULECTOMY/  EXCISION PERIANAL LESIONS  06/26/2002    OB History    No data available       Home Medications    Prior to Admission medications   Medication Sig Start Date End Date Taking? Authorizing Provider  acetaminophen (TYLENOL) 500 MG tablet Take 1,000 mg by mouth every 6 (six) hours as needed.    [provider]  albuterol (PROVENTIL HFA;VENTOLIN HFA) 108 (90 Base) MCG/ACT inhaler Inhale 2  puffs into the lungs every 6 (six) hours as needed for wheezing or shortness of breath. 01/19/16   Ahmed, Chesley Mires, MD  albuterol (PROVENTIL HFA;VENTOLIN HFA) 108 (90 Base) MCG/ACT inhaler Inhale 2 puffs into the lungs every 4 (four) hours as needed for wheezing or shortness of breath. 02/16/16   [name], MD  benzonatate (TESSALON) 200 MG capsule Take 1 capsule (200 mg total) by mouth 3 (three) times daily as needed for cough. 02/16/16   [name], MD  ibuprofen (ADVIL,MOTRIN)  200 MG tablet Take 200 mg by mouth every 6 (six) hours as needed for cramping.    [provider]    Family History Family History  Problem Relation Age of Onset  . Hypertension Mother   . Heart attack Father   . Diabetes Maternal Grandmother   . Cancer Maternal Aunt     Social History Social History  Substance Use Topics  . Smoking status: Current Every Day Smoker    Packs/day: 0.50    Years: 32.00    Types: Cigarettes  . Smokeless tobacco: Never Used     Comment: pt is working on [employer] since 2016  . Alcohol use Yes     Comment: occasional beer     Allergies   Patient has no known allergies.   Review of Systems Review of Systems  Constitutional: Negative for fever.  HENT: Negative for sore throat.   Respiratory: Positive for cough, shortness of breath and wheezing. Negative for hemoptysis.   Cardiovascular: Positive for chest pain ("soreness").  Gastrointestinal: Positive for diarrhea. Negative for abdominal pain and vomiting.  Musculoskeletal: Positive for myalgias.  All other systems reviewed and are negative.    Physical Exam Updated Vital Signs BP 116/61 (BP Location: Right Arm)   Pulse 79   Temp 98.2 F (36.8 C)   Resp 18   LMP 04/24/2015 (Approximate)   SpO2 94%   Physical Exam CONSTITUTIONAL: Well developed/well nourished HEAD: Normocephalic/atraumatic EYES: EOMI/PERRL ENMT: Mucous membranes moist NECK: supple no meningeal signs SPINE/BACK:entire spine nontender CV: S1/S2 noted, no murmurs/rubs/gallops noted LUNGS: Coarse wheezes bilaterally. Chest - tenderness to right chest ABDOMEN: soft, nontender, no rebound or guarding, bowel sounds noted throughout abdomen GU:no cva tenderness NEURO: Pt is awake/alert/appropriate, moves all extremitiesx4.  No facial droop.   EXTREMITIES: pulses normal/equal, full ROM SKIN: warm, color normal PSYCH: no abnormalities of mood noted, alert and oriented to situation   ED Treatments / Results    DIAGNOSTIC STUDIES: Oxygen Saturation is 94% on RA, adequate by my interpretation.    COORDINATION OF CARE: 2:06 AM-Discussed next steps with pt. Pt verbalized understanding and is agreeable with the plan. Will order medications.   Labs (all labs ordered are listed, but only abnormal results are displayed) Labs Reviewed - No data to display  EKG  EKG Interpretation  Date/Time:  Saturday July 31 2016 23:31:20 EDT Ventricular Rate:  88 PR Interval:  162 QRS Duration: 62 QT Interval:  380 QTC Calculation: 459 R Axis:   52 Text Interpretation:  Normal sinus rhythm Junctional ST depression, probably normal Borderline ECG Interpretation limited secondary to artifact Confirmed by Ripley Fraise [id number]) on 08/01/2016 2:01:17 AM       Radiology Dg Chest 2 View  Result Date: 08/01/2016 CLINICAL DATA:  Shortness of breath and wheezing EXAM: CHEST  2 VIEW COMPARISON:  02/19/2014 FINDINGS: The heart size and mediastinal contours are within normal limits. Both lungs are clear. The visualized skeletal structures are unremarkable. IMPRESSION: No active  cardiopulmonary disease. Electronically Signed   By: Donavan Foil M.D.   On: 08/01/2016 00:00    Procedures Procedures (including critical care time)  Medications Ordered in ED Medications  albuterol (PROVENTIL) (2.5 MG/3ML) 0.083% nebulizer solution 5 mg (5 mg Nebulization Given 07/31/16 2334)     Initial Impression / Assessment and Plan / ED Course  I have reviewed the triage vital signs and the nursing notes.  Pertinent  imaging results that were available during my care of the patient were reviewed by me and considered in my medical decision making (see chart for details).     3:36 AM Pt in the ED for cough/wheezing/SOB She reports cough/congestion and also diarrhea Suspect viral illness may have triggered this episode She was given neb treatments and feels improved She continues have residual wheeze but overall improved work  of breathing She would like to be discharged RA pulse ox >95% on final evaluation Will d/c home  Discussed strict ER precautions with patient  including worsening SOB despite medications at home All pertinent questions answered prior to discharge Patient appropriate and safe for discharge at this time Advised to quit smoking  Final Clinical Impressions(s) / ED Diagnoses   Final diagnoses:  Moderate persistent asthma with exacerbation    New Prescriptions New Prescriptions   ALBUTEROL (PROVENTIL HFA;VENTOLIN HFA) 108 (90 BASE) MCG/ACT INHALER    Inhale 1-2 puffs into the lungs every 6 (six) hours as needed for wheezing or shortness of breath.   ALBUTEROL (PROVENTIL) (2.5 MG/3ML) 0.083% NEBULIZER SOLUTION    Take 3 mLs (2.5 mg total) by nebulization every 4 (four) hours as needed for wheezing or shortness of breath.   PREDNISONE (DELTASONE) 50 MG TABLET    One tablet PO daily for 4 days  I personally performed the services described in this documentation, which was scribed in my presence. The recorded information has been reviewed and is accurate.        Ripley Fraise, MD 08/01/16 [phone]

## 2017-03-20 ENCOUNTER — Ambulatory Visit (HOSPITAL_COMMUNITY)
Admission: EM | Admit: 2017-03-20 | Discharge: 2017-03-20 | Disposition: A | Discharge status: Home / Self Care | Payer: Self-pay | Attending: Family Medicine | Admitting: Family Medicine

## 2017-03-20 ENCOUNTER — Encounter (HOSPITAL_COMMUNITY): Payer: Self-pay | Admitting: *Deleted

## 2017-03-20 ENCOUNTER — Other Ambulatory Visit: Payer: Self-pay

## 2017-03-20 DIAGNOSIS — K0889 Other specified disorders of teeth and supporting structures: Secondary | ICD-10-CM

## 2017-03-20 MED ORDER — HYDROCODONE-ACETAMINOPHEN 5-325 MG PO TABS: 1.0000 | ORAL_TABLET | ORAL | 0 refills | Status: AC | PRN

## 2017-03-20 MED ORDER — AMOXICILLIN-POT CLAVULANATE 875-125 MG PO TABS: 1.0000 | ORAL_TABLET | Freq: Two times a day (BID) | ORAL | 0 refills | Status: AC

## 2017-03-20 NOTE — ED Triage Notes (Signed)
Bump on upper part of her right mouth, headache,

## 2017-03-20 NOTE — ED Provider Notes (Signed)
[facility]    CSN: [id number] Arrival date & time: 03/20/17  [place]     History   Chief Complaint Chief Complaint  Patient presents with  . Dental Abscess    Right bump on upper mouth.     HPI [name] is a 57 y.o. female presenting with dental pain.  States over the past 2-3 days she has noticed a swelling in relation to 1 of her teeth.  It has grown in size and is causing pain.  Pain radiating towards the ear and towards I.  Denies any changes in hearing or any changes in vision.  She states she has 4 teeth and needs to have them removed. Pain causing HA.  Denies fevers.   HPI  Past Medical History:  Diagnosis Date  . 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 Asthma    seasonal and environmental  . 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 History of cellulitis    post op 2004 vulvectomy surgery --  left thigh celluitis-- resolved  . History of cervical cancer    2002  carcinoma in situ  s/p  conization  . History of peptic ulcer    age 74's   due to drinking alcohol --  resolved when quit  . Pelvic cramping   . PMB (postmenopausal bleeding)     Patient Active Problem List   Diagnosis Date Noted  . Hot flashes, menopausal 01/19/2016  . Shoulder pain, left 01/19/2016  . Postmenopausal bleeding 09/17/2015  . Reactive airway disease 05/01/2015    Past Surgical History:  Procedure Laterality Date  . DILATION AND CURETTAGE OF UTERUS N/A 09/25/2015   Procedure: DILATATION AND CURETTAGE;  Surgeon: [name], MD;  Location: [facility];  Service: Gynecology;  Laterality: N/A;  . EUA/  COLD KNIFE CERVICAL CONIZATION/  ENDOCERVICAL CURETTAGE/  PARTIAL LEFT VULVECTOMY  04/10/2000  . LEFT INGUINAL  LYMPHADENECTOMY/  LEFT MODIFIED RADICAL VULVECTOMY/  RIGHT SIMPLE VULECTOMY/  EXCISION PERIANAL LESIONS  06/26/2002    OB History    No data available       Home Medications    Prior to Admission medications   Medication Sig Start Date End Date Taking? Authorizing Provider  acetaminophen (TYLENOL) 500 MG tablet Take 1,000 mg by mouth every 6 (six) hours as needed.    [provider]  albuterol (PROVENTIL HFA;VENTOLIN HFA) 108 (90 Base) MCG/ACT inhaler Inhale 1-2 puffs into the lungs every 6 (six) hours as needed for wheezing or shortness of breath. 08/01/16   Ripley Fraise, MD  albuterol (PROVENTIL) (2.5 MG/3ML) 0.083% nebulizer solution Take 3 mLs (2.5 mg total) by nebulization every 4 (four) hours as needed for wheezing or shortness of breath. 08/01/16   Ripley Fraise, MD  amoxicillin-clavulanate (AUGMENTIN) 875-125 MG tablet Take 1 tablet by mouth every 12 (twelve) hours for 7 days. 03/20/17 03/27/17  Lanique Gonzalo C, PA-C  HYDROcodone-acetaminophen (NORCO/VICODIN) 5-325 MG tablet Take 1 tablet by mouth every 4 (four) hours as needed for up to 2 days. 03/20/17 03/22/17  Sheyla Zaffino C, PA-C  ibuprofen (ADVIL,MOTRIN) 200 MG tablet Take 200 mg by mouth every 6 (six) hours as needed for cramping.  [provider]  predniSONE (DELTASONE) 50 MG tablet One tablet PO daily for 4 days 08/01/16   Ripley Fraise, MD    Family History Family History  Problem Relation Age of Onset  . Hypertension Mother   . Heart attack Father   . Diabetes Maternal Grandmother   . Cancer Maternal Aunt     Social History Social History   Tobacco Use  . Smoking status: Current Every Day Smoker    Packs/day: 0.50    Years: 32.00    Pack years: 16.00    Types: Cigarettes  . Smokeless tobacco: Never Used  . Tobacco comment: pt is working on [place] since 2016  Substance Use Topics  . Alcohol use: Yes    Comment: occasional beer  . Drug use: No     Allergies   Patient has no  known allergies.   Review of Systems Review of Systems  Constitutional: Negative for fever.  HENT: Positive for dental problem. Negative for ear pain, sore throat and trouble swallowing.   Eyes: Negative for pain and visual disturbance.  Respiratory: Negative for shortness of breath.   Cardiovascular: Negative for chest pain.  Gastrointestinal: Negative for nausea and vomiting.  Neurological: Positive for headaches. Negative for dizziness and light-headedness.     Physical Exam Triage Vital Signs ED Triage Vitals  Enc Vitals Group     BP 03/20/17 1642 (!) 133/92     Pulse Rate 03/20/17 1642 90     Resp --      Temp 03/20/17 1642 98.9 F (37.2 C)     Temp Source 03/20/17 1642 Oral     SpO2 03/20/17 1642 97 %     Weight --      Height --      Head Circumference --      Peak Flow --      Pain Score 03/20/17 1641 10     Pain Loc --      Pain Edu? --      Excl. in [place]? --    No data found.  Updated Vital Signs BP (!) 133/92 (BP Location: Left Arm)   Pulse 90   Temp 98.9 F (37.2 C) (Oral)   LMP 04/24/2015 (Approximate)   SpO2 97%    Physical Exam  Constitutional: She is oriented to person, place, and time. She appears well-developed and well-nourished. No distress.  HENT:  Head: Normocephalic and atraumatic.  Mouth/Throat: Uvula is midline and mucous membranes are normal. No oral lesions. No trismus in the jaw. No uvula swelling. No oropharyngeal exudate.    1 cm swelling behind tooth on right upper jaw. No evidence of swelling between cheek and tooth.   Eyes: Conjunctivae are normal.  Neck: Neck supple.  Cardiovascular: Normal rate.  Pulmonary/Chest: Effort normal. No respiratory distress.  Neurological: She is alert and oriented to person, place, and time.     UC Treatments / Results  Labs (all labs ordered are listed, but only abnormal results are displayed) Labs Reviewed - No data to display  EKG  EKG Interpretation None       Radiology No  results found.  Procedures Procedures (including critical care time)  Medications Ordered in UC Medications - No data to display   Initial Impression / Assessment and Plan / UC Course  I have reviewed the triage vital signs and the nursing notes.  Pertinent labs & imaging results that were available during my care of the patient were reviewed by me and considered  in my medical decision making (see chart for details).     Patient with dental pain and swelling- concerning for infection vs abscess. Will provide augmentin for 7 days. Norco for 2 days.  Advised to only use for severe pain. Advised this will cause sedation and to not drive after use. Use Tylenol ibuprofen for mild to moderate pain.  Please return if swelling not improving with antibiotic. Discussed strict return precautions. Patient verbalized understanding and is agreeable with plan.   Final Clinical Impressions(s) / UC Diagnoses   Final diagnoses:  Pain, dental    ED Discharge Orders        Ordered    amoxicillin-clavulanate (AUGMENTIN) 875-125 MG tablet  Every 12 hours     03/20/17 1657    HYDROcodone-acetaminophen (NORCO/VICODIN) 5-325 MG tablet  Every 4 hours PRN     03/20/17 1657       Controlled Substance Prescriptions [place] Controlled Substance Registry consulted? Yes, I have consulted the [place] Controlled Substances Registry for this patient, and feel the risk/benefit ratio today is favorable for proceeding with this prescription for a controlled substance.  No prescriptions on record.   Janith Lima, PA-C 03/20/17 1706

## 2017-03-20 NOTE — Discharge Instructions (Signed)

## 2017-04-15 ENCOUNTER — Other Ambulatory Visit: Payer: Self-pay

## 2017-04-15 ENCOUNTER — Encounter (HOSPITAL_COMMUNITY): Payer: Self-pay | Admitting: Emergency Medicine

## 2017-04-15 ENCOUNTER — Ambulatory Visit (HOSPITAL_COMMUNITY)
Admission: EM | Admit: 2017-04-15 | Discharge: 2017-04-15 | Disposition: A | Discharge status: Home / Self Care | Payer: Self-pay | Attending: Internal Medicine | Admitting: Internal Medicine

## 2017-04-15 DIAGNOSIS — B349 Viral infection, unspecified: Secondary | ICD-10-CM

## 2017-04-15 MED ORDER — FLUTICASONE PROPIONATE 50 MCG/ACT NA SUSP: 2.0000 | Freq: Every day | NASAL | 0 refills | Status: DC

## 2017-04-15 MED ORDER — BENZONATATE 100 MG PO CAPS: 100.0000 mg | ORAL_CAPSULE | Freq: Three times a day (TID) | ORAL | 0 refills | Status: DC

## 2017-04-15 MED ORDER — IPRATROPIUM BROMIDE 0.06 % NA SOLN: 2.0000 | Freq: Four times a day (QID) | NASAL | 0 refills | Status: DC

## 2017-04-15 NOTE — ED Provider Notes (Signed)
[facility]    CSN: [id number] Arrival date & time: 04/15/17  1008     History   Chief Complaint Chief Complaint  Patient presents with  . URI    HPI [name] is a 57 y.o. female.   57 year old female with history of asthma comes in for 5-day history of URI symptoms.  Nonproductive cough, rhinorrhea, nasal congestion, sore throat, bilateral ear pain. Subjective fever with chills. otc tylenol/ibuprofen without relief. Current everyday smoker, 16 pack year history. Positive sick contact.       Past Medical History:  Diagnosis Date  . 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 Asthma    seasonal and environmental  . 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 History of cellulitis    post op 2004 vulvectomy surgery --  left thigh celluitis-- resolved  . History of cervical cancer    2002  carcinoma in situ  s/p  conization  . History of peptic ulcer    age 20's   due to drinking alcohol --  resolved when quit  . Pelvic cramping   . PMB (postmenopausal bleeding)     Patient Active Problem List   Diagnosis Date Noted  . Hot flashes, menopausal 01/19/2016  . Shoulder pain, left 01/19/2016  . Postmenopausal bleeding 09/17/2015  . Reactive airway disease 05/01/2015    Past Surgical History:  Procedure Laterality Date  . DILATION AND CURETTAGE OF UTERUS N/A 09/25/2015   Procedure: DILATATION AND CURETTAGE;  Surgeon: [name], MD;  Location: [facility];  Service: Gynecology;  Laterality: N/A;  . EUA/  COLD KNIFE CERVICAL CONIZATION/  ENDOCERVICAL CURETTAGE/  PARTIAL LEFT VULVECTOMY  04/10/2000  . LEFT INGUINAL LYMPHADENECTOMY/  LEFT MODIFIED RADICAL VULVECTOMY/  RIGHT SIMPLE VULECTOMY/  EXCISION  PERIANAL LESIONS  06/26/2002    OB History    No data available       Home Medications    Prior to Admission medications   Medication Sig Start Date End Date Taking? Authorizing Provider  acetaminophen (TYLENOL) 500 MG tablet Take 1,000 mg by mouth every 6 (six) hours as needed.   Yes [provider]  albuterol (PROVENTIL HFA;VENTOLIN HFA) 108 (90 Base) MCG/ACT inhaler Inhale 1-2 puffs into the lungs every 6 (six) hours as needed for wheezing or shortness of breath. 08/01/16  Yes Ripley Fraise, MD  albuterol (PROVENTIL) (2.5 MG/3ML) 0.083% nebulizer solution Take 3 mLs (2.5 mg total) by nebulization every 4 (four) hours as needed for wheezing or shortness of breath. 08/01/16  Yes Ripley Fraise, MD  ibuprofen (ADVIL,MOTRIN) 200 MG tablet Take 200 mg by mouth every 6 (six) hours as needed for cramping.   Yes [provider]  benzonatate (TESSALON) 100 MG capsule Take 1 capsule (100 mg total) by mouth every 8 (eight) hours. 04/15/17   Tasia Catchings, Anaiza Behrens V, PA-C  fluticasone (FLONASE) 50 MCG/ACT nasal spray Place 2 sprays into both nostrils daily. 04/15/17   Tasia Catchings, Mycah Mcdougall V, PA-C  ipratropium (ATROVENT) 0.06 % nasal spray Place 2 sprays into both nostrils 4 (four) times daily. 04/15/17   Ok Edwards, PA-C    Family History Family History  Problem Relation Age of Onset  . Hypertension  Mother   . Heart attack Father   . Diabetes Maternal Grandmother   . Cancer Maternal Aunt     Social History Social History   Tobacco Use  . Smoking status: Current Every Day Smoker    Packs/day: 0.50    Years: 32.00    Pack years: 16.00    Types: Cigarettes  . Smokeless tobacco: Never Used  . Tobacco comment: pt is working on [place] since 2016  Substance Use Topics  . Alcohol use: Yes    Comment: occasional beer  . Drug use: No     Allergies   Patient has no known allergies.   Review of Systems Review of Systems  Reason unable to perform ROS: See HPI as above.     Physical  Exam Triage Vital Signs ED Triage Vitals  Enc Vitals Group     BP 04/15/17 1041 121/81     Pulse Rate 04/15/17 1041 74     Resp --      Temp 04/15/17 1041 99 F (37.2 C)     Temp Source 04/15/17 1041 Oral     SpO2 04/15/17 1041 100 %     Weight --      Height --      Head Circumference --      Peak Flow --      Pain Score 04/15/17 1039 9     Pain Loc --      Pain Edu? --      Excl. in [place]? --    No data found.  Updated Vital Signs BP 121/81 (BP Location: Left Arm)   Pulse 74   Temp 99 F (37.2 C) (Oral)   LMP 04/24/2015 (Approximate)   SpO2 100%   Visual Acuity Right Eye Distance:   Left Eye Distance:   Bilateral Distance:    Right Eye Near:   Left Eye Near:    Bilateral Near:     Physical Exam  Constitutional: She is oriented to person, place, and time. She appears well-developed and well-nourished. No distress.  HENT:  Head: Normocephalic and atraumatic.  Right Ear: Tympanic membrane, external ear and ear canal normal. Tympanic membrane is not erythematous and not bulging.  Left Ear: Tympanic membrane, external ear and ear canal normal. Tympanic membrane is not erythematous and not bulging.  Nose: Mucosal edema and rhinorrhea present. Right sinus exhibits maxillary sinus tenderness. Right sinus exhibits no frontal sinus tenderness. Left sinus exhibits maxillary sinus tenderness. Left sinus exhibits no frontal sinus tenderness.  Mouth/Throat: Uvula is midline and mucous membranes are normal. Posterior oropharyngeal erythema present. No tonsillar exudate.  Eyes: Conjunctivae are normal. Pupils are equal, round, and reactive to light.  Neck: Normal range of motion. Neck supple.  Cardiovascular: Normal rate, regular rhythm and normal heart sounds. Exam reveals no gallop and no friction rub.  No murmur heard. Pulmonary/Chest: Effort normal and breath sounds normal. She has no decreased breath sounds. She has no wheezes. She has no rhonchi. She has no rales.   Lymphadenopathy:    She has no cervical adenopathy.  Neurological: She is alert and oriented to person, place, and time.  Skin: Skin is warm and dry.  Psychiatric: She has a normal mood and affect. Her behavior is normal. Judgment normal.     UC Treatments / Results  Labs (all labs ordered are listed, but only abnormal results are displayed) Labs Reviewed - No data to display  EKG  EKG Interpretation None  Radiology No results found.  Procedures Procedures (including critical care time)  Medications Ordered in UC Medications - No data to display   Initial Impression / Assessment and Plan / UC Course  I have reviewed the triage vital signs and the nursing notes.  Pertinent labs & imaging results that were available during my care of the patient were reviewed by me and considered in my medical decision making (see chart for details).    Discussed with patient history and exam most consistent with viral URI. Symptomatic treatment as needed. Push fluids. Return precautions given.    Final Clinical Impressions(s) / UC Diagnoses   Final diagnoses:  Viral syndrome    ED Discharge Orders        Ordered    fluticasone (FLONASE) 50 MCG/ACT nasal spray  Daily     04/15/17 1127    ipratropium (ATROVENT) 0.06 % nasal spray  4 times daily     04/15/17 1127    benzonatate (TESSALON) 100 MG capsule  Every 8 hours     04/15/17 1127        Ok Edwards, PA-C 04/15/17 1134

## 2017-04-15 NOTE — ED Triage Notes (Signed)
Pt complains of bilateral ear pain, sore throat, congestion and cough since Sunday.

## 2017-04-15 NOTE — Discharge Instructions (Signed)
Tessalon for cough. Start flonase, atrovent nasal spray for nasal congestion/drainage. You can use over the counter nasal saline rinse such as neti pot for nasal congestion. Keep hydrated, your urine should be clear to pale yellow in color. Tylenol/motrin for fever and pain. Monitor for any worsening of symptoms, chest pain, shortness of breath, wheezing, swelling of the throat, follow up for reevaluation.   For sore throat try using a honey-based tea. Use 3 teaspoons of honey with juice squeezed from half lemon. Place shaved pieces of ginger into 1/2-1 cup of water and warm over stove top. Then mix the ingredients and repeat every 4 hours as needed.

## 2017-06-08 ENCOUNTER — Ambulatory Visit (HOSPITAL_COMMUNITY)
Admission: EM | Admit: 2017-06-08 | Discharge: 2017-06-08 | Disposition: A | Discharge status: Home / Self Care | Payer: Self-pay | Attending: Family Medicine | Admitting: Family Medicine

## 2017-06-08 ENCOUNTER — Encounter (HOSPITAL_COMMUNITY): Payer: Self-pay | Admitting: Emergency Medicine

## 2017-06-08 DIAGNOSIS — K029 Dental caries, unspecified: Secondary | ICD-10-CM

## 2017-06-08 MED ORDER — PENICILLIN V POTASSIUM 500 MG PO TABS: 500.0000 mg | ORAL_TABLET | Freq: Three times a day (TID) | ORAL | 1 refills | Status: DC

## 2017-06-08 NOTE — ED Triage Notes (Signed)
Pt sts left sided dental pain with swelling

## 2017-06-08 NOTE — ED Provider Notes (Signed)
[place]   [id number] 06/08/17 Arrival Time: 7001   SUBJECTIVE:  [name] is a 57 y.o. female who presents to the urgent care with complaint of left sided dental pain with swelling.  Patient knows she has "bad teeth" and fillings have fallen out in teeth #14, 15, and 30.  Past Medical History:  Diagnosis Date  . 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 Asthma    seasonal and environmental  . 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 History of cellulitis    post op 2004 vulvectomy surgery --  left thigh celluitis-- resolved  . History of cervical cancer    2002  carcinoma in situ  s/p  conization  . History of peptic ulcer    age 29's   due to drinking alcohol --  resolved when quit  . Pelvic cramping   . PMB (postmenopausal bleeding)    Family History  Problem Relation Age of Onset  . Hypertension Mother   . Heart attack Father   . Diabetes Maternal Grandmother   . Cancer Maternal Aunt    Social History   Socioeconomic History  . Marital status: Single    Spouse name: Not on file  . Number of children: Not on file  . Years of education: Not on file  . Highest education level: Not on file  Occupational History  . Not on file  Social Needs  . Financial resource strain: Not on file  . Food insecurity:    Worry: Not on file    Inability: Not on file  . Transportation needs:    Medical: Not on file    Non-medical: Not on file  Tobacco Use  . Smoking status: Current Every Day Smoker    Packs/day: 0.50    Years: 32.00    Pack years: 16.00    Types: Cigarettes  . Smokeless tobacco: Never Used  . Tobacco comment: pt is working on [employer] since 2016  Substance and Sexual Activity  . Alcohol use: Yes   Comment: occasional beer  . Drug use: No  . Sexual activity: Never    Birth control/protection: Post-menopausal  Lifestyle  . Physical activity:    Days per week: Not on file    Minutes per session: Not on file  . Stress: Not on file  Relationships  . Social connections:    Talks on phone: Not on file    Gets together: Not on file    Attends religious service: Not on file    Active member of club or organization: Not on file    Attends meetings of clubs or organizations: Not on file    Relationship status: Not on file  . Intimate partner violence:    Fear of current or ex partner: Not on file    Emotionally abused: Not on file    Physically abused: Not on file    Forced sexual activity: Not on file  Other Topics Concern  . Not on file  Social History Narrative   Works as a Printmaker   No outpatient medications have been marked as taking for the 06/08/17 encounter [facility] Encounter).   No Known Allergies  ROS: As per HPI, remainder of ROS negative.   OBJECTIVE:   Vitals:   06/08/17 1059  BP: (!) 133/93  Pulse: 96  Resp: 18  Temp: (!) 97.5 F (36.4 C)  TempSrc: Oral  SpO2: 95%     General appearance: alert; no distress Eyes: PERRL; EOMI; conjunctiva normal HENT: swollen left cheeck, atraumatic; TMs normal, canal normal, external ears normal without trauma; nasal mucosa normal; dental caries down to roots of teeth #14, 15, 30 Neck: supple Back: no CVA tenderness Extremities: no cyanosis or edema; symmetrical with no gross deformities Skin: warm and dry Neurologic: normal gait; grossly normal Psychological: alert and cooperative; normal mood and affect      Labs:  Results for orders placed or performed during the hospital encounter of 09/25/15  Hemoglobin-hemacue, POC  Result Value Ref Range   Hemoglobin 12.2 12.0 - 15.0 g/dL    Labs Reviewed - No data to display  No results found.     ASSESSMENT & PLAN:  1. Dental caries       Meds ordered this encounter  Medications  . penicillin v potassium (VEETID) 500 MG tablet    Sig: Take 1 tablet (500 mg total) by mouth 3 (three) times daily.    Dispense:  30 tablet    Refill:  1    Reviewed expectations re: course of current medical issues. Questions answered. Outlined signs and symptoms indicating need for more acute intervention. Patient verbalized understanding. After Visit Summary given.      Robyn Haber, MD 06/08/17 [phone]

## 2017-11-11 ENCOUNTER — Encounter: Payer: Self-pay | Admitting: Internal Medicine

## 2018-03-05 ENCOUNTER — Encounter (HOSPITAL_COMMUNITY): Payer: Self-pay

## 2018-03-05 ENCOUNTER — Other Ambulatory Visit: Payer: Self-pay

## 2018-03-05 ENCOUNTER — Ambulatory Visit (HOSPITAL_COMMUNITY)
Admission: EM | Admit: 2018-03-05 | Discharge: 2018-03-05 | Disposition: A | Discharge status: Home / Self Care | Payer: BLUE CROSS/BLUE SHIELD | Attending: Family Medicine | Admitting: Family Medicine

## 2018-03-05 DIAGNOSIS — R69 Illness, unspecified: Secondary | ICD-10-CM | POA: Diagnosis not present

## 2018-03-05 DIAGNOSIS — J111 Influenza due to unidentified influenza virus with other respiratory manifestations: Secondary | ICD-10-CM

## 2018-03-05 MED ORDER — ALBUTEROL SULFATE HFA 108 (90 BASE) MCG/ACT IN AERS: 1.0000 | INHALATION_SPRAY | Freq: Four times a day (QID) | RESPIRATORY_TRACT | 0 refills | Status: DC | PRN

## 2018-03-05 MED ORDER — BENZONATATE 200 MG PO CAPS: 200.0000 mg | ORAL_CAPSULE | Freq: Two times a day (BID) | ORAL | 0 refills | Status: DC | PRN

## 2018-03-05 MED ORDER — DM-GUAIFENESIN ER 30-600 MG PO TB12: 1.0000 | ORAL_TABLET | Freq: Two times a day (BID) | ORAL | 0 refills | Status: DC

## 2018-03-05 NOTE — ED Triage Notes (Signed)
Pt cc she states she has fever, cough and body aches. X 3 days.

## 2018-03-05 NOTE — Discharge Instructions (Signed)
Rest Push fluids Take the tessalon AND the mucinex DM every 12 hours for cough and congestion Use ibuprofen or acetaminophen for pain and fever See your PCP if not improving in a couple of days

## 2018-03-05 NOTE — ED Provider Notes (Signed)
[place]    CSN: [id number] Arrival date & time: 03/05/18  1001     History   Chief Complaint Chief Complaint  Patient presents with  . Influenza    HPI [name] is a 58 y.o. female.   HPI  Patient had the sudden onset of respiratory symptoms and body aches 3 to 4 days ago.  She states she is having a really congested cough, the cough is keeping her awake at night.  She states she is almost out of her albuterol inhaler.  She has albuterol for asthma.  She is a non-smoker.  She has no chest pain.  She does feel quite fatigued.  She initially had fever, but she does not have a temperature now.  She has continued to try to work , but was sent home early from work yesterday. She states she is otherwise well.  She is think she had a flu shot but is not certain.  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  She has never had a pneumonia.  Usually well-controlled asthma, with infrequent flare  Past Medical History:  Diagnosis Date  . 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 Asthma    seasonal and environmental  . 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 History of cellulitis    post op 2004 vulvectomy surgery --  left thigh celluitis-- resolved  . History of cervical cancer    2002  carcinoma in situ  s/p  conization  . History of peptic ulcer    age 34's   due to drinking alcohol --  resolved when quit  . Pelvic cramping   . PMB (postmenopausal bleeding)     Patient Active Problem List   Diagnosis Date Noted  . Hot flashes, menopausal 01/19/2016  . Shoulder pain, left 01/19/2016  . Postmenopausal bleeding 09/17/2015  . Reactive airway disease 05/01/2015    Past Surgical History:  Procedure Laterality Date  .  DILATION AND CURETTAGE OF UTERUS N/A 09/25/2015   Procedure: DILATATION AND CURETTAGE;  Surgeon: [name], MD;  Location: [facility];  Service: Gynecology;  Laterality: N/A;  . EUA/  COLD KNIFE CERVICAL CONIZATION/  ENDOCERVICAL CURETTAGE/  PARTIAL LEFT VULVECTOMY  04/10/2000  . LEFT INGUINAL LYMPHADENECTOMY/  LEFT MODIFIED RADICAL VULVECTOMY/  RIGHT SIMPLE VULECTOMY/  EXCISION PERIANAL LESIONS  06/26/2002    OB History   No obstetric history on file.      Home Medications    Prior to Admission medications   Medication Sig Start Date End Date Taking? Authorizing Provider  acetaminophen (TYLENOL) 500 MG tablet Take 1,000 mg by mouth every 6 (six) hours as needed.    [provider]  albuterol (PROVENTIL HFA;VENTOLIN HFA) 108 (90 Base) MCG/ACT inhaler Inhale 1-2 puffs into the lungs every 6 (six) hours as needed for wheezing or shortness of breath. 03/05/18   Raylene Everts, MD  albuterol (PROVENTIL) (2.5 MG/3ML) 0.083% nebulizer solution Take 3 mLs (2.5 mg total) by nebulization every 4 (four) hours as needed for wheezing or shortness of breath. 08/01/16   Ripley Fraise, MD  benzonatate (TESSALON) 200 MG capsule Take 1 capsule (200 mg total) by mouth 2 (  two) times daily as needed for cough. 03/05/18   Raylene Everts, MD  dextromethorphan-guaiFENesin [facility] DM) 30-600 MG 12hr tablet Take 1 tablet by mouth 2 (two) times daily. 03/05/18   Raylene Everts, MD  fluticasone [facility]) 50 MCG/ACT nasal spray Place 2 sprays into both nostrils daily. 04/15/17   Tasia Catchings, Amy V, PA-C  ibuprofen (ADVIL,MOTRIN) 200 MG tablet Take 200 mg by mouth every 6 (six) hours as needed for cramping.    [provider]  ipratropium (ATROVENT) 0.06 % nasal spray Place 2 sprays into both nostrils 4 (four) times daily. 04/15/17   Ok Edwards, PA-C    Family History Family History  Problem Relation Age of Onset  . Hypertension Mother   . Heart attack Father   . Diabetes  Maternal Grandmother   . Cancer Maternal Aunt     Social History Social History   Tobacco Use  . Smoking status: Current Every Day Smoker    Packs/day: 0.50    Years: 32.00    Pack years: 16.00    Types: Cigarettes  . Smokeless tobacco: Never Used  . Tobacco comment: pt is working on [place] since 2016  Substance Use Topics  . Alcohol use: Yes    Comment: occasional beer  . Drug use: No     Allergies   Patient has no known allergies.   Review of Systems Review of Systems  Constitutional: Positive for chills, fatigue and fever.  HENT: Positive for congestion, postnasal drip and rhinorrhea. Negative for ear pain and sore throat.   Eyes: Negative for pain and visual disturbance.  Respiratory: Positive for cough. Negative for shortness of breath.   Cardiovascular: Negative for chest pain and palpitations.  Gastrointestinal: Positive for nausea. Negative for abdominal pain and vomiting.  Genitourinary: Negative for dysuria and hematuria.  Musculoskeletal: Positive for myalgias. Negative for arthralgias and back pain.  Skin: Negative for color change and rash.  Neurological: Negative for seizures and syncope.  All other systems reviewed and are negative.    Physical Exam Triage Vital Signs ED Triage Vitals  Enc Vitals Group     BP 03/05/18 1027 113/79     Pulse Rate 03/05/18 1027 81     Resp 03/05/18 1027 16     Temp 03/05/18 1027 98.2 F (36.8 C)     Temp Source 03/05/18 1027 Oral     SpO2 03/05/18 1027 98 %     Weight 03/05/18 1028 150 lb (68 kg)     Height --      Head Circumference --      Peak Flow --      Pain Score 03/05/18 1028 7     Pain Loc --      Pain Edu? --      Excl. in [place]? --    No data found.  Updated Vital Signs BP 113/79 (BP Location: Left Arm)   Pulse 81   Temp 98.2 F (36.8 C) (Oral)   Resp 16   Wt 68 kg   LMP 04/24/2015 (Approximate)   SpO2 98%   BMI 28.34 kg/m  ar:     Physical Exam Constitutional:      General: She is  not in acute distress.    Appearance: She is well-developed. She is obese. She is ill-appearing.  HENT:     Head: Normocephalic and atraumatic.     Right Ear: Tympanic membrane, ear canal and external ear normal.     Left Ear: Tympanic membrane,  ear canal and external ear normal.     Nose: Congestion and rhinorrhea present.  Eyes:     Conjunctiva/sclera: Conjunctivae normal.     Pupils: Pupils are equal, round, and reactive to light.  Neck:     Musculoskeletal: Normal range of motion.  Cardiovascular:     Rate and Rhythm: Normal rate.  Pulmonary:     Effort: Pulmonary effort is normal. No respiratory distress.  Abdominal:     General: There is no distension.     Palpations: Abdomen is soft.  Musculoskeletal: Normal range of motion.  Skin:    General: Skin is warm and dry.  Neurological:     Mental Status: She is alert.      UC Treatments / Results  Labs (all labs ordered are listed, but only abnormal results are displayed) Labs Reviewed - No data to display  EKG None  Radiology No results found.  Procedures Procedures (including critical care time)  Medications Ordered in UC Medications - No data to display  Initial Impression / Assessment and Plan / UC Course  I have reviewed the triage vital signs and the nursing notes.  Pertinent labs & imaging results that were available during my care of the patient were reviewed by me and considered in my medical decision making (see chart for details).      Final Clinical Impressions(s) / UC Diagnoses   Final diagnoses:  Influenza-like illness     Discharge Instructions     Rest Push fluids Take the tessalon AND the mucinex DM every 12 hours for cough and congestion Use ibuprofen or acetaminophen for pain and fever See your PCP if not improving in a couple of days    ED Prescriptions    Medication Sig Dispense Auth. Provider   albuterol (PROVENTIL HFA;VENTOLIN HFA) 108 (90 Base) MCG/ACT inhaler Inhale 1-2  puffs into the lungs every 6 (six) hours as needed for wheezing or shortness of breath. 1 Inhaler Raylene Everts, MD   benzonatate (TESSALON) 200 MG capsule Take 1 capsule (200 mg total) by mouth 2 (two) times daily as needed for cough. 20 capsule Raylene Everts, MD   dextromethorphan-guaiFENesin [facility] DM) 30-600 MG 12hr tablet Take 1 tablet by mouth 2 (two) times daily. 20 tablet Raylene Everts, MD     Controlled Substance Prescriptions [place] Controlled Substance Registry consulted? Not Applicable   Raylene Everts, MD 03/05/18 2055

## 2018-10-17 ENCOUNTER — Encounter (HOSPITAL_COMMUNITY): Payer: Self-pay

## 2018-10-17 ENCOUNTER — Ambulatory Visit (HOSPITAL_COMMUNITY)
Admission: EM | Admit: 2018-10-17 | Discharge: 2018-10-17 | Disposition: A | Discharge status: Home / Self Care | Payer: BC Managed Care – PPO | Attending: Physician Assistant | Admitting: Physician Assistant

## 2018-10-17 ENCOUNTER — Other Ambulatory Visit: Payer: Self-pay

## 2018-10-17 DIAGNOSIS — R220 Localized swelling, mass and lump, head: Secondary | ICD-10-CM

## 2018-10-17 MED ORDER — SULFAMETHOXAZOLE-TRIMETHOPRIM 800-160 MG PO TABS: 1.0000 | ORAL_TABLET | Freq: Two times a day (BID) | ORAL | 0 refills | Status: AC

## 2018-10-17 NOTE — Discharge Instructions (Addendum)
Return if any problems. Warm compresses to face.

## 2018-10-17 NOTE — ED Provider Notes (Signed)
[place]    CSN: [id number] Arrival date & time: 10/17/18  0800      History   Chief Complaint Chief Complaint  Patient presents with  . Allergies    HPI [name] is a 58 y.o. female.   Complains of swelling to the right side of her nose patient reports she is swollen inside of her nose this area is painful and red.  Patient complains of congestion.  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  Patient denies any sore throat.  Patient reports the right side of her face is painful to touch.  The history is provided by the patient. No language interpreter was used.    Past Medical History:  Diagnosis Date  . 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 Asthma    seasonal and environmental  . 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 History of cellulitis    post op 2004 vulvectomy surgery --  left thigh celluitis-- resolved  . History of cervical cancer    2002  carcinoma in situ  s/p  conization  . History of peptic ulcer    age 84's   due to drinking alcohol --  resolved when quit  . Pelvic cramping   . PMB (postmenopausal bleeding)     Patient Active Problem List   Diagnosis Date Noted  . Hot flashes, menopausal 01/19/2016  . Shoulder pain, left 01/19/2016  . Postmenopausal bleeding 09/17/2015  . Reactive airway disease 05/01/2015    Past Surgical History:  Procedure Laterality Date  . DILATION AND CURETTAGE OF UTERUS N/A 09/25/2015   Procedure: DILATATION AND CURETTAGE;  Surgeon: [name], MD;  Location: [facility];  Service: Gynecology;  Laterality: N/A;  . EUA/  COLD KNIFE CERVICAL CONIZATION/  ENDOCERVICAL  CURETTAGE/  PARTIAL LEFT VULVECTOMY  04/10/2000  . LEFT INGUINAL LYMPHADENECTOMY/  LEFT MODIFIED RADICAL VULVECTOMY/  RIGHT SIMPLE VULECTOMY/  EXCISION PERIANAL LESIONS  06/26/2002    OB History   No obstetric history on file.      Home Medications    Prior to Admission medications   Medication Sig Start Date End Date Taking? Authorizing Provider  acetaminophen (TYLENOL) 500 MG tablet Take 1,000 mg by mouth every 6 (six) hours as needed.   Yes [provider]  albuterol (PROVENTIL HFA;VENTOLIN HFA) 108 (90 Base) MCG/ACT inhaler Inhale 1-2 puffs into the lungs every 6 (six) hours as needed for wheezing or shortness of breath. 03/05/18   Raylene Everts, MD  albuterol (PROVENTIL) (2.5 MG/3ML) 0.083% nebulizer solution Take 3 mLs (2.5 mg total) by nebulization every 4 (four) hours as needed for wheezing or shortness of breath. 08/01/16   Ripley Fraise, MD  benzonatate (TESSALON) 200 MG capsule Take 1 capsule (200 mg total) by mouth 2 (two) times daily as needed for cough. 03/05/18   Raylene Everts, MD  dextromethorphan-guaiFENesin [facility] DM) 30-600 MG 12hr tablet Take 1 tablet by mouth 2 (two) times daily. 03/05/18   Raylene Everts, MD  fluticasone [facility]) 50 MCG/ACT nasal spray Place 2 sprays into both nostrils daily. 04/15/17  Tasia Catchings, Amy V, PA-C  ibuprofen (ADVIL,MOTRIN) 200 MG tablet Take 200 mg by mouth every 6 (six) hours as needed for cramping.    [provider]  ipratropium (ATROVENT) 0.06 % nasal spray Place 2 sprays into both nostrils 4 (four) times daily. 04/15/17   Tasia Catchings, Amy V, PA-C  sulfamethoxazole-trimethoprim (BACTRIM DS) 800-160 MG tablet Take 1 tablet by mouth 2 (two) times daily for 7 days. 10/17/18 10/24/18  Fransico Meadow, PA-C  sulfamethoxazole-trimethoprim (BACTRIM DS) 800-160 MG tablet Take 1 tablet by mouth 2 (two) times daily for 7 days. 10/17/18 10/24/18  Fransico Meadow, PA-C    Family History Family History  Problem Relation Age of Onset  .  Hypertension Mother   . Heart attack Father   . Diabetes Maternal Grandmother   . Cancer Maternal Aunt     Social History Social History   Tobacco Use  . Smoking status: Current Every Day Smoker    Packs/day: 0.50    Years: 32.00    Pack years: 16.00    Types: Cigarettes  . Smokeless tobacco: Never Used  . Tobacco comment: pt is working on [place] since 2016  Substance Use Topics  . Alcohol use: Yes    Comment: occasional beer  . Drug use: No     Allergies   Patient has no known allergies.   Review of Systems Review of Systems  HENT: Positive for postnasal drip and rhinorrhea.   All other systems reviewed and are negative.    Physical Exam Triage Vital Signs ED Triage Vitals  Enc Vitals Group     BP 10/17/18 0819 138/86     Pulse Rate 10/17/18 0819 67     Resp 10/17/18 0819 16     Temp 10/17/18 0819 98.1 F (36.7 C)     Temp Source 10/17/18 0819 Oral     SpO2 10/17/18 0819 100 %     Weight --      Height --      Head Circumference --      Peak Flow --      Pain Score 10/17/18 0818 3     Pain Loc --      Pain Edu? --      Excl. in [place]? --    No data found.  Updated Vital Signs BP 138/86 (BP Location: Right Arm)   Pulse 67   Temp 98.1 F (36.7 C) (Oral)   Resp 16   LMP 04/24/2015 (Approximate)   SpO2 100%   Visual Acuity Right Eye Distance:   Left Eye Distance:   Bilateral Distance:    Right Eye Near:   Left Eye Near:    Bilateral Near:     Physical Exam Constitutional:      Appearance: She is well-developed.  HENT:     Head: Normocephalic and atraumatic.     Right Ear: Tympanic membrane normal.     Left Ear: Tympanic membrane normal.     Nose: Nose normal.     Comments: Swollen right inner nostril tender right side of face and right nose slightly warm to touch.    Mouth/Throat:     Pharynx: Posterior oropharyngeal erythema present.  Eyes:     Conjunctiva/sclera: Conjunctivae normal.     Pupils: Pupils are equal, round, and reactive  to light.  Neck:     Musculoskeletal: Normal range of motion and neck supple.  Cardiovascular:     Rate and Rhythm: Normal rate.  Pulmonary:  Effort: Pulmonary effort is normal.  Abdominal:     Palpations: Abdomen is soft.  Musculoskeletal: Normal range of motion.  Skin:    General: Skin is warm and dry.  Neurological:     Mental Status: She is alert and oriented to person, place, and time.      UC Treatments / Results  Labs (all labs ordered are listed, but only abnormal results are displayed) Labs Reviewed - No data to display  EKG   Radiology No results found.  Procedures Procedures (including critical care time)  Medications Ordered in UC Medications - No data to display  Initial Impression / Assessment and Plan / UC Course  I have reviewed the triage vital signs and the nursing notes.  Pertinent labs & imaging results that were available during my care of the patient were reviewed by me and considered in my medical decision making (see chart for details).     MDM patient may be getting a pimple inside her nose cellulitis starting on the side of her face I will start her on Bactrim she is advised to apply warm compresses patient is given a note for out of work for the next 2 days Final Clinical Impressions(s) / UC Diagnoses   Final diagnoses:  Facial swelling     Discharge Instructions     Return if any problems. Warm compresses to face.    ED Prescriptions    Medication Sig Dispense Auth. Provider   sulfamethoxazole-trimethoprim (BACTRIM DS) 800-160 MG tablet Take 1 tablet by mouth 2 (two) times daily for 7 days. 14 tablet Caryl Ada K, [place]   sulfamethoxazole-trimethoprim (BACTRIM DS) 800-160 MG tablet Take 1 tablet by mouth 2 (two) times daily for 7 days. 14 tablet Fransico Meadow, [place]     Controlled Substance Prescriptions [place] Controlled Substance Registry consulted? Not Applicable  An After Visit Summary was printed and given to the  patient.    Fransico Meadow, [place] 10/17/18 [phone]

## 2018-10-17 NOTE — ED Triage Notes (Signed)
Patient presents to Urgent Care with complaints of nasal congestion and sore right nare since last week. 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

## 2019-05-23 ENCOUNTER — Encounter (HOSPITAL_COMMUNITY): Payer: Self-pay

## 2019-05-23 ENCOUNTER — Other Ambulatory Visit: Payer: Self-pay

## 2019-05-23 ENCOUNTER — Ambulatory Visit (INDEPENDENT_AMBULATORY_CARE_PROVIDER_SITE_OTHER): Payer: Self-pay

## 2019-05-23 ENCOUNTER — Ambulatory Visit (HOSPITAL_COMMUNITY)
Admission: EM | Admit: 2019-05-23 | Discharge: 2019-05-23 | Disposition: A | Discharge status: Home / Self Care | Payer: Self-pay | Attending: Internal Medicine | Admitting: Internal Medicine

## 2019-05-23 DIAGNOSIS — M898X1 Other specified disorders of bone, shoulder: Secondary | ICD-10-CM

## 2019-05-23 DIAGNOSIS — M25511 Pain in right shoulder: Secondary | ICD-10-CM

## 2019-05-23 MED ORDER — CYCLOBENZAPRINE HCL 10 MG PO TABS: 10.0000 mg | ORAL_TABLET | Freq: Two times a day (BID) | ORAL | 0 refills | Status: DC | PRN

## 2019-05-23 MED ORDER — KETOROLAC TROMETHAMINE 30 MG/ML IJ SOLN
INTRAMUSCULAR | Status: AC
  Filled 2019-05-23: qty 1

## 2019-05-23 MED ORDER — IBUPROFEN 600 MG PO TABS: 600.0000 mg | ORAL_TABLET | Freq: Four times a day (QID) | ORAL | 0 refills | Status: DC | PRN

## 2019-05-23 MED ORDER — KETOROLAC TROMETHAMINE 30 MG/ML IJ SOLN
30.0000 mg | Freq: Once | INTRAMUSCULAR | Status: AC
  Administered 2019-05-23: 18:00:00 30 mg via INTRAMUSCULAR
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
## 2019-08-16 ENCOUNTER — Other Ambulatory Visit: Payer: Self-pay

## 2019-08-16 ENCOUNTER — Ambulatory Visit (HOSPITAL_COMMUNITY)
Admission: EM | Admit: 2019-08-16 | Discharge: 2019-08-16 | Disposition: A | Discharge status: Home / Self Care | Payer: Self-pay | Attending: Family Medicine | Admitting: Family Medicine

## 2019-08-16 ENCOUNTER — Ambulatory Visit (INDEPENDENT_AMBULATORY_CARE_PROVIDER_SITE_OTHER): Payer: Self-pay

## 2019-08-16 ENCOUNTER — Encounter (HOSPITAL_COMMUNITY): Payer: Self-pay

## 2019-08-16 DIAGNOSIS — M79672 Pain in left foot: Secondary | ICD-10-CM

## 2019-08-16 DIAGNOSIS — S92122A Displaced fracture of body of left talus, initial encounter for closed fracture: Secondary | ICD-10-CM

## 2019-08-16 DIAGNOSIS — M25572 Pain in left ankle and joints of left foot: Secondary | ICD-10-CM

## 2019-08-16 MED ORDER — KETOROLAC TROMETHAMINE 30 MG/ML IJ SOLN
INTRAMUSCULAR | Status: AC
  Filled 2019-08-16: qty 1

## 2019-08-16 MED ORDER — IBUPROFEN 600 MG PO TABS
600.0000 mg | ORAL_TABLET | Freq: Four times a day (QID) | ORAL | 0 refills | Status: DC | PRN
Start: 2019-08-16 — End: 2022-06-14

## 2019-08-16 MED ORDER — CYCLOBENZAPRINE HCL 10 MG PO TABS
10.0000 mg | ORAL_TABLET | Freq: Two times a day (BID) | ORAL | 0 refills | Status: DC | PRN
Start: 2019-08-16 — End: 2020-10-05

## 2019-08-16 MED ORDER — KETOROLAC TROMETHAMINE 30 MG/ML IJ SOLN
30.0000 mg | Freq: Once | INTRAMUSCULAR | Status: AC
  Administered 2019-08-16: 30 mg via INTRAMUSCULAR

## 2019-08-16 NOTE — ED Provider Notes (Addendum)
[place]   [id number] 08/16/19 Arrival Time: 1754  KG:URKYH PAIN  SUBJECTIVE: History from: patient. [name] is a 59 y.o. female complains of left foot and ankle pain, swelling, tenderness.  Reports that symptoms are worse when she is walking and bearing weight.  Reports that she is taken Tylenol and ibuprofen for the pain without relief.  Reports that she fell, and hit her foot on her tub.  Reports that this has been going on for about 3 weeks.  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  Reports that this makes the pain unbearable. Denies similar symptoms in the past.  Denies fever, chills, erythema, ecchymosis, effusion, weakness, numbness and tingling, saddle paresthesias, loss of bowel or bladder function.      ROS: As per HPI.  All other pertinent ROS negative.     Past Medical History:  Diagnosis Date  . 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 Asthma    seasonal and environmental  . 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 History of cellulitis    post op 2004 vulvectomy surgery --  left thigh celluitis-- resolved  . History of cervical cancer    2002  carcinoma in situ  s/p  conization  . History of peptic ulcer    age 50's   due to drinking alcohol --  resolved when quit  . Pelvic cramping   . PMB (postmenopausal bleeding)    Past Surgical History:  Procedure Laterality Date  . DILATION AND CURETTAGE OF UTERUS N/A 09/25/2015   Procedure: DILATATION AND CURETTAGE;  Surgeon: [name], MD;  Location: [facility];  Service: Gynecology;  Laterality: N/A;  . EUA/  COLD KNIFE CERVICAL CONIZATION/  ENDOCERVICAL CURETTAGE/  PARTIAL LEFT VULVECTOMY  04/10/2000  . LEFT INGUINAL  LYMPHADENECTOMY/  LEFT MODIFIED RADICAL VULVECTOMY/  RIGHT SIMPLE VULECTOMY/  EXCISION PERIANAL LESIONS  06/26/2002   No Known Allergies No current facility-administered medications on file prior to encounter.   Current Outpatient Medications on File Prior to Encounter  Medication Sig Dispense Refill  . acetaminophen (TYLENOL) 500 MG tablet Take 1,000 mg by mouth every 6 (six) hours as needed.    Marland Kitchen albuterol (PROVENTIL HFA;VENTOLIN HFA) 108 (90 Base) MCG/ACT inhaler Inhale 1-2 puffs into the lungs every 6 (six) hours as needed for wheezing or shortness of breath. 1 Inhaler 0  . albuterol (PROVENTIL) (2.5 MG/3ML) 0.083% nebulizer solution Take 3 mLs (2.5 mg total) by nebulization every 4 (four) hours as needed for wheezing or shortness of breath. 30 vial 0  . fluticasone (FLONASE) 50 MCG/ACT nasal spray Place 2 sprays into both nostrils daily. 1 g 0  . [DISCONTINUED] ipratropium (ATROVENT) 0.06 % nasal spray Place 2 sprays into both nostrils 4 (four) times daily. 15 mL 0   Social History   Socioeconomic History  . Marital status: Single    Spouse name: Not on file  . Number of children: Not on file  . Years of education: Not on file  . Highest education level: Not on file  Occupational History  . Not on file  Tobacco Use  . Smoking status: Current Every Day Smoker  Packs/day: 0.50    Years: 32.00    Pack years: 16.00    Types: Cigarettes  . Smokeless tobacco: Never Used  . Tobacco comment: pt is working on [employer] since 2016  Vaping Use  . Vaping Use: Never used  Substance and Sexual Activity  . Alcohol use: Yes    Comment: occasional beer  . Drug use: No  . Sexual activity: Never    Birth control/protection: Post-menopausal  Other Topics Concern  . Not on file  Social History Narrative   Works as a Printmaker   Social Determinants of Radio broadcast assistant Strain:   . Difficulty of Paying Living Expenses:   Food Insecurity:   . Worried About Paediatric nurse in the Last Year:   . Arboriculturist in the Last Year:   Transportation Needs:   . Film/video editor (Medical):   Marland Kitchen Lack of Transportation (Non-Medical):   Physical Activity:   . Days of Exercise per Week:   . Minutes of Exercise per Session:   Stress:   . Feeling of Stress :   Social Connections:   . Frequency of Communication with Friends and Family:   . Frequency of Social Gatherings with Friends and Family:   . Attends Religious Services:   . Active Member of Clubs or Organizations:   . Attends Archivist Meetings:   Marland Kitchen Marital Status:   Intimate Partner Violence:   . Fear of Current or Ex-Partner:   . Emotionally Abused:   Marland Kitchen Physically Abused:   . Sexually Abused:    Family History  Problem Relation Age of Onset  . Hypertension Mother   . Heart attack Father   . Diabetes Maternal Grandmother   . Cancer Maternal Aunt     OBJECTIVE:  Vitals:   08/16/19 1805  BP: 136/83  Pulse: 89  Resp: 16  Temp: 97.8 F (36.6 C)  TempSrc: Oral  SpO2: 100%    General appearance: ALERT; in no acute distress.  Head: NCAT Lungs: Normal respiratory effort CV: pedal pulses 2+ bilaterally. Cap refill < 2 seconds Musculoskeletal:  Inspection: Skin warm, dry, clear and intact; L ankle with erythema, swelling, ecchymosis Palpation: L ankle very tender to palpation ROM: limited ROM active and passive with L ankle Skin: warm and dry Neurologic: Ambulates without difficulty; Sensation intact about the upper/ lower extremities Psychological: alert and cooperative; normal mood and affect  DIAGNOSTIC STUDIES:  DG Ankle Complete Left  Result Date: 08/16/2019 CLINICAL DATA:  Left foot pain status post slipping in the tuft. EXAM: LEFT ANKLE COMPLETE - 3+ VIEW; LEFT FOOT - COMPLETE 3+ VIEW COMPARISON:  None. FINDINGS: There are no significant degenerative changes. There appears to be a small avulsion fracture arising from the anterior talus. There is mild  surrounding soft tissue swelling. There is a small plantar calcaneal spur. There is soft tissue swelling about the ankle. IMPRESSION: Acute, mildly displaced avulsion fracture arising from the anterior talus. Electronically Signed   By: Constance Holster M.D.   On: 08/16/2019 19:15   DG Foot Complete Left  Result Date: 08/16/2019 CLINICAL DATA:  Left foot pain status post slipping in the tuft. EXAM: LEFT ANKLE COMPLETE - 3+ VIEW; LEFT FOOT - COMPLETE 3+ VIEW COMPARISON:  None. FINDINGS: There are no significant degenerative changes. There appears to be a small avulsion fracture arising from the anterior talus. There is mild surrounding soft tissue swelling. There is a small plantar calcaneal spur.  There is soft tissue swelling about the ankle. IMPRESSION: Acute, mildly displaced avulsion fracture arising from the anterior talus. Electronically Signed   By: Constance Holster M.D.   On: 08/16/2019 19:15     ASSESSMENT & PLAN:  1. Acute left ankle pain   2. Left foot pain   3. Closed displaced fracture of body of left talus, initial encounter       Meds ordered this encounter  Medications  . ketorolac (TORADOL) 30 MG/ML injection 30 mg  . ibuprofen (ADVIL) 600 MG tablet    Sig: Take 1 tablet (600 mg total) by mouth every 6 (six) hours as needed.    Dispense:  30 tablet    Refill:  0    Order Specific Question:   Supervising Provider    Answer:   Chase Picket [id number]  . cyclobenzaprine (FLEXERIL) 10 MG tablet    Sig: Take 1 tablet (10 mg total) by mouth 2 (two) times daily as needed for muscle spasms.    Dispense:  20 tablet    Refill:  0    Order Specific Question:   Supervising Provider    Answer:   Chase Picket [[id number]]   Toradol 30 mg IM given in office today Prescribed ibuprofen 600 Prescribe Flexeril Brace applied Did not want Cam Walker Continue conservative management of rest, ice, and gentle stretches Take ibuprofen as needed for pain relief (may cause  abdominal discomfort, ulcers, and GI bleeds avoid taking with other NSAIDs) Take cyclobenzaprine at nighttime for symptomatic relief. Avoid driving or operating heavy machinery while using medication. Follow up with PCP if symptoms persist Return or go to the ER if you have any new or worsening symptoms (fever, chills, chest pain, abdominal pain, changes in bowel or bladder habits, pain radiating into lower legs)   Reviewed expectations re: course of current medical issues. Questions answered. Outlined signs and symptoms indicating need for more acute intervention. Patient verbalized understanding. After Visit Summary given.       Faustino Congress, NP 08/16/19 1925    Faustino Congress, NP 08/16/19 1926

## 2019-08-16 NOTE — ED Triage Notes (Signed)
Pt presents today for left ankle pain after falling in tub 3 weeks ago. Upon assessment pt ankle found to be swollen and red. Pt ambulated into treatment space unassisted with altered gait. Pt has limited range of motion r/t pain. Pt denies OTC treatment or relieving factors.

## 2019-08-16 NOTE — Discharge Instructions (Addendum)
Take the ibuprofen as needed.  Rest and elevate your foot.  Apply ice packs 2-3 times a day for up to 20 minutes each.  Wear the Ace wrap as needed for comfort.    Follow up with your primary care provider or an orthopedist if you symptoms continue or worsen;  Or if you develop new symptoms, such as numbness, tingling, or weakness.

## 2019-08-16 NOTE — ED Notes (Signed)
Advised pt awaiting radiologist interpretation of xray. Offered to apply ice to foot, pt declined.

## 2020-10-05 ENCOUNTER — Other Ambulatory Visit: Payer: Self-pay

## 2020-10-05 ENCOUNTER — Encounter (HOSPITAL_COMMUNITY): Payer: Self-pay | Admitting: Oncology

## 2020-10-05 ENCOUNTER — Emergency Department (HOSPITAL_COMMUNITY): Payer: Self-pay

## 2020-10-05 ENCOUNTER — Emergency Department (HOSPITAL_COMMUNITY)
Admission: EM | Admit: 2020-10-05 | Discharge: 2020-10-05 | Disposition: A | Discharge status: Home / Self Care | Payer: Self-pay | Attending: Emergency Medicine | Admitting: Emergency Medicine

## 2020-10-05 DIAGNOSIS — Z20822 Contact with and (suspected) exposure to covid-19: Secondary | ICD-10-CM | POA: Insufficient documentation

## 2020-10-05 DIAGNOSIS — R0789 Other chest pain: Secondary | ICD-10-CM | POA: Insufficient documentation

## 2020-10-05 DIAGNOSIS — F17210 Nicotine dependence, cigarettes, uncomplicated: Secondary | ICD-10-CM | POA: Insufficient documentation

## 2020-10-05 DIAGNOSIS — R072 Precordial pain: Secondary | ICD-10-CM

## 2020-10-05 DIAGNOSIS — J45909 Unspecified asthma, uncomplicated: Secondary | ICD-10-CM | POA: Insufficient documentation

## 2020-10-05 LAB — COMPREHENSIVE METABOLIC PANEL
ALT: 21 U/L (ref 0–44)
AST: 23 U/L (ref 15–41)
Albumin: 4.2 g/dL (ref 3.5–5.0)
Alkaline Phosphatase: 74 U/L (ref 38–126)
Anion gap: 8 (ref 5–15)
BUN: 11 mg/dL (ref 6–20)
CO2: 26 mmol/L (ref 22–32)
Calcium: 9.2 mg/dL (ref 8.9–10.3)
Chloride: 107 mmol/L (ref 98–111)
Creatinine, Ser: 0.62 mg/dL (ref 0.44–1.00)
GFR, Estimated: >60 mL/min (ref >60)
Glucose, Bld: 98 mg/dL (ref 70–99)
Potassium: 4.4 mmol/L (ref 3.5–5.1)
Sodium: 141 mmol/L (ref 135–145)
Total Bilirubin: 0.5 mg/dL (ref 0.3–1.2)
Total Protein: 8 g/dL (ref 6.5–8.1)

## 2020-10-05 LAB — CBC WITH DIFFERENTIAL/PLATELET
Abs Immature Granulocytes: 0.02 10*3/uL (ref 0.00–0.07)
Basophils Absolute: 0 10*3/uL (ref 0.0–0.1)
Basophils Relative: 0 %
Eosinophils Absolute: 0.1 10*3/uL (ref 0.0–0.5)
Eosinophils Relative: 2 %
HCT: 44.7 % (ref 36.0–46.0)
Hemoglobin: 14.8 g/dL (ref 12.0–15.0)
Immature Granulocytes: 0 %
Lymphocytes Relative: 28 %
Lymphs Abs: 2.2 10*3/uL (ref 0.7–4.0)
MCH: 32.2 pg (ref 26.0–34.0)
MCHC: 33.1 g/dL (ref 30.0–36.0)
MCV: 97.2 fL (ref 80.0–100.0)
Monocytes Absolute: 0.6 10*3/uL (ref 0.1–1.0)
Monocytes Relative: 8 %
Neutro Abs: 4.8 10*3/uL (ref 1.7–7.7)
Neutrophils Relative %: 62 %
Platelets: 371 10*3/uL (ref 150–400)
RBC: 4.6 MIL/uL (ref 3.87–5.11)
RDW: 13.2 % (ref 11.5–15.5)
WBC: 7.8 10*3/uL (ref 4.0–10.5)
nRBC: 0 % (ref 0.0–0.2)

## 2020-10-05 LAB — RESP PANEL BY RT-PCR (FLU A&B, COVID) ARPGX2
Influenza A by PCR: NEGATIVE
Influenza B by PCR: NEGATIVE
SARS Coronavirus 2 by RT PCR: NEGATIVE

## 2020-10-05 LAB — TROPONIN I (HIGH SENSITIVITY)
Troponin I (High Sensitivity): 4 ng/L (ref <18)
Troponin I (High Sensitivity): 4 ng/L (ref <18)

## 2020-10-05 MED ORDER — METHOCARBAMOL 500 MG PO TABS
500.0000 mg | ORAL_TABLET | Freq: Three times a day (TID) | ORAL | 0 refills | Status: DC | PRN
Start: 2020-10-05 — End: 2021-04-22

## 2020-10-05 MED ORDER — ACETAMINOPHEN 500 MG PO TABS
1000.0000 mg | ORAL_TABLET | Freq: Once | ORAL | Status: AC
  Administered 2020-10-05: 1000 mg via ORAL
  Filled 2020-10-05: qty 2

## 2020-10-05 MED ORDER — ALBUTEROL SULFATE HFA 108 (90 BASE) MCG/ACT IN AERS: 1.0000 | INHALATION_SPRAY | Freq: Four times a day (QID) | RESPIRATORY_TRACT | 0 refills | Status: DC | PRN

## 2020-10-05 MED ORDER — METHOCARBAMOL 500 MG PO TABS
500.0000 mg | ORAL_TABLET | Freq: Once | ORAL | Status: AC
  Administered 2020-10-05: 500 mg via ORAL
  Filled 2020-10-05: qty 1

## 2020-10-05 NOTE — ED Triage Notes (Signed)
Pt reports right sided chest pain, shob, diaphoresis that began last night.  EMS to pt's home last night however she did not wish to be transported at that time.

## 2020-10-05 NOTE — ED Notes (Signed)
Pt discharged. Instructions and prescriptions given. AAOX4. Pt in no apparent distress or pain. The opportunity to ask questions was provided.  

## 2020-10-05 NOTE — ED Provider Notes (Signed)
[facility] DEPT Provider Note   CSN: [id number] Arrival date & time: 10/05/20  0924     History Chief Complaint  Patient presents with   Chest Pain    [name] is a 60 y.o. female with a history of asthma, peptic ulcer disease, cervical and vulva cancer 2002.  Presents to the emergency department with a chief complaint of chest pain.  Patient reports that chest pain started on 8/18 and has been constant since then.  Pain is located to the right side of her chest and radiates to right shoulder.  Patient describes pain as sharp.  Pain is worse with inhalation.  Patient reports associated shortness of breath.  Patient endorses cough with clear mucus production, chills, nasal congestion, and diarrhea.  Patient denies any fevers, rhinorrhea, sore throat, palpitations, leg swelling, dental pain, nausea, vomiting, constipation, diarrhea, syncope, near syncope.    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  Patient reports COVID-19 vaccination and booster.  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 (negative margins & nodes)   History of cellulitis    post op 2004 vulvectomy surgery --  left thigh celluitis-- resolved   History of cervical cancer    2002  carcinoma in situ  s/p  conization   History of peptic  ulcer    age 26's   due to drinking alcohol --  resolved when quit   Pelvic cramping    PMB (postmenopausal bleeding)     Patient Active Problem List   Diagnosis Date Noted   Hot flashes, menopausal 01/19/2016   Shoulder pain, left 01/19/2016   Postmenopausal bleeding 09/17/2015   Reactive airway disease 05/01/2015    Past Surgical History:  Procedure Laterality Date   DILATION AND CURETTAGE OF UTERUS N/A 09/25/2015   Procedure: DILATATION AND CURETTAGE;  Surgeon: [name], MD;  Location: [facility];  Service: Gynecology;  Laterality: N/A;   EUA/  COLD KNIFE CERVICAL CONIZATION/  ENDOCERVICAL CURETTAGE/  PARTIAL LEFT VULVECTOMY  04/10/2000   LEFT INGUINAL LYMPHADENECTOMY/  LEFT MODIFIED RADICAL VULVECTOMY/  RIGHT SIMPLE VULECTOMY/  EXCISION PERIANAL LESIONS  06/26/2002     OB History   No obstetric history on file.     Family History  Problem Relation Age of Onset   Hypertension Mother    Heart attack Father    Diabetes Maternal Grandmother    Cancer Maternal Aunt     Social History   Tobacco Use   Smoking status: Every Day    Packs/day: 0.50    Years: 32.00    Pack years: 16.00    Types: Cigarettes   Smokeless tobacco: Never   Tobacco comments:    pt is working on  quiting since 2016  Vaping Use   Vaping Use: Never used  Substance Use Topics   Alcohol use: Yes    Comment: occasional beer   Drug use: No    Home Medications Prior to Admission medications   Medication Sig Start Date End Date Taking? Authorizing Provider  acetaminophen (TYLENOL) 500 MG tablet Take 1,000 mg by mouth every 6 (six) hours as needed.    [provider]  albuterol (PROVENTIL HFA;VENTOLIN HFA) 108 (90 Base) MCG/ACT inhaler Inhale 1-2 puffs into the lungs every 6 (six) hours as needed for wheezing or shortness of breath. 03/05/18   Raylene Everts, MD  albuterol (PROVENTIL) (2.5 MG/3ML) 0.083% nebulizer solution Take 3 mLs (2.5 mg total) by nebulization  every 4 (four) hours as needed for wheezing or shortness of breath. 08/01/16   Ripley Fraise, MD  cyclobenzaprine (FLEXERIL) 10 MG tablet Take 1 tablet (10 mg total) by mouth 2 (two) times daily as needed for muscle spasms. 08/16/19   Faustino Congress, NP  fluticasone (FLONASE) 50 MCG/ACT nasal spray Place 2 sprays into both nostrils daily. 04/15/17   Tasia Catchings, Amy V, PA-C  ibuprofen (ADVIL) 600 MG tablet Take 1 tablet (600 mg total) by mouth every 6 (six) hours as needed. 08/16/19   Faustino Congress, NP  ipratropium (ATROVENT) 0.06 % nasal spray Place 2 sprays into both nostrils 4 (four) times daily. 04/15/17 05/23/19  Ok Edwards, PA-C    Allergies    Patient has no known allergies.  Review of Systems   Review of Systems  Constitutional:  Positive for chills. Negative for fever.  HENT:  Positive for congestion. Negative for rhinorrhea and sore throat.   Eyes:  Negative for visual disturbance.  Respiratory:  Positive for cough and shortness of breath.   Cardiovascular:  Positive for chest pain. Negative for palpitations and leg swelling.  Gastrointestinal:  Positive for diarrhea. Negative for abdominal distention, abdominal pain, anal bleeding, blood in stool, constipation, nausea, rectal pain and vomiting.  Genitourinary:  Negative for difficulty urinating and dysuria.  Musculoskeletal:  Negative for back pain, myalgias and neck pain.  Skin:  Negative for color change and rash.  Neurological:  Negative for dizziness, syncope, light-headedness and headaches.  Psychiatric/Behavioral:  Negative for confusion.    Physical Exam Updated Vital Signs BP (!) 125/93 (BP Location: Left Arm)   Pulse 100   Temp 98.8 F (37.1 C) (Oral)   Resp (!) 22   Ht '5\' 1"'$  (1.549 m)   Wt 71.7 kg   LMP 04/24/2015 (Approximate)   SpO2 100%   BMI 29.85 kg/m   Physical Exam Vitals and nursing note reviewed.  Constitutional:      General: She is not in acute distress.    Appearance: She is not ill-appearing,  toxic-appearing or diaphoretic.  HENT:     Head: Normocephalic.  Eyes:     General: No scleral icterus.       Right eye: No discharge.        Left eye: No discharge.  Cardiovascular:     Rate and Rhythm: Normal rate.     Pulses:          Radial pulses are 2+ on the right side and 2+ on the left side.  Pulmonary:     Effort: Pulmonary effort is normal. No respiratory distress.     Breath sounds: Normal breath sounds. No stridor.  Abdominal:     General: There is no distension. There are no signs of injury.  Palpations: Abdomen is soft. There is no mass or pulsatile mass.     Tenderness: There is no abdominal tenderness. There is no guarding or rebound.  Musculoskeletal:     Cervical back: Neck supple.     Right lower leg: Normal.     Left lower leg: Normal.  Skin:    General: Skin is warm and dry.     Coloration: Skin is not cyanotic.  Neurological:     General: No focal deficit present.     Mental Status: She is alert.     GCS: GCS eye subscore is 4. GCS verbal subscore is 5. GCS motor subscore is 6.  Psychiatric:        Behavior: Behavior is cooperative.    ED Results / Procedures / Treatments   Labs (all labs ordered are listed, but only abnormal results are displayed) Labs Reviewed  RESP PANEL BY RT-PCR (FLU A&B, COVID) [id number]  CBC WITH DIFFERENTIAL/PLATELET  COMPREHENSIVE METABOLIC PANEL  TROPONIN I (HIGH SENSITIVITY)  TROPONIN I (HIGH SENSITIVITY)    EKG EKG Interpretation  Date/Time:  Sunday October 05 2020 09:31:49 EDT Ventricular Rate:  96 PR Interval:  156 QRS Duration: 80 QT Interval:  361 QTC Calculation: 457 R Axis:   42 Text Interpretation: Sinus rhythm Biatrial enlargement 12 Lead; Mason-Likar No STEMI Confirmed by Regan Lemming ([id number]) on 10/05/2020 12:04:49 PM  Radiology DG Chest Portable 1 View  Result Date: 10/05/2020 CLINICAL DATA:  Chest pain, shortness of breath, and cough for 4 days. Asthma. EXAM: PORTABLE CHEST 1 VIEW COMPARISON:   07/31/2016 FINDINGS: The heart size and mediastinal contours are within normal limits. Both lungs are clear. The visualized skeletal structures are unremarkable. IMPRESSION: No active disease. Electronically Signed   By: Marlaine Hind M.D.   On: 10/05/2020 11:59    Procedures Procedures   Medications Ordered in ED Medications  acetaminophen (TYLENOL) tablet 1,000 mg (1,000 mg Oral Given 10/05/20 1141)  methocarbamol (ROBAXIN) tablet 500 mg (500 mg Oral Given 10/05/20 1141)    ED Course  I have reviewed the triage vital signs and the nursing notes.  Pertinent labs & imaging results that were available during my care of the patient were reviewed by me and considered in my medical decision making (see chart for details).    MDM Rules/Calculators/A&P                           Alert 60 year old female no acute distress, nontoxic appearing.  Presents to ED with chief complaint of chest pain.  Patient reports associated shortness of breath.  Symptoms have been present and constant since 8/18.  Patient also endorses cough, chills, nasal congestion, and diarrhea.  Pain is worse with inhalation.  Will obtain ACS work-up.  Patient given Robaxin and Tylenol.  EKG shows sinus rhythm. Chest x-ray shows no active cardiopulmonary disease Troponin 4 and 4 with delta of 0 Heart score 2 Suspicion for ACS at this time.  Low suspicion for PE as patient is low risk based on Wells criteria for PE.  CMP and CBC unremarkable. Respiratory panel negative for COVID-19 and influenza.  Patient reports improvement in symptoms after receiving Robaxin and Tylenol.  Patient hemodynamically stable.  Will discharge patient at this time.  Patient to have close follow-up with PCP.  Patient requests refill of albuterol.  States that she is currently out of this medication..  Chart review patient previously prescribed albuterol inhaler.  Discussed results, findings, treatment  and follow up. Patient advised of return  precautions. Patient verbalized understanding and agreed with plan.   Final Clinical Impression(s) / ED Diagnoses Final diagnoses:  Precordial chest pain    Rx / DC Orders ED Discharge Orders          Ordered    methocarbamol (ROBAXIN) 500 MG tablet  Every 8 hours PRN        10/05/20 1341    albuterol (VENTOLIN HFA) 108 (90 Base) MCG/ACT inhaler  Every 6 hours PRN        10/05/20 1342             Dyann Ruddle 10/05/20 2146    Regan Lemming, MD 10/06/20 1201

## 2020-10-05 NOTE — Discharge Instructions (Addendum)
You came to the emergency department today to be evaluated for your chest pain.  Your physical exam was reassuring.  Your lab work, EKG, and chest x-ray did not suggest you are having an acute heart attack today.  Pain improved after receiving a muscle relaxer, I have given you prescription for the muscle relaxer, please take this medication as prescribed.  Please follow-up with the [facility], call tomorrow to schedule an appointment within the next week.  Today you were prescribed Methocarbamol (Robaxin).  Methocarbamol (Robaxin) is used to treat muscle spasms/pain.  It works by helping to relax the muscles.  Drowsiness, dizziness, lightheadedness, stomach upset, nausea/vomiting, or blurred vision may occur.  Do not drive, use machinery, or do anything that needs alertness or clear vision until you can do it safely.  Do not combine this medication with alcoholic beverages, marijuana, or other central nervous system depressants.    Your COVID-19 and influenza testing were negative.  Get help right away if: Your chest pain gets worse. You have a cough that gets worse, or you cough up blood. You have severe pain in your abdomen. You faint. You have sudden, unexplained chest discomfort. You have sudden, unexplained discomfort in your arms, back, neck, or jaw. You have shortness of breath at any time. You suddenly start to sweat, or your skin gets clammy. You feel nausea or you vomit. You suddenly feel lightheaded or dizzy. You have severe weakness, or unexplained weakness or fatigue. Your heart begins to beat quickly, or it feels like it is skipping beats.

## 2020-11-28 DIAGNOSIS — Z1152 Encounter for screening for COVID-19: Secondary | ICD-10-CM | POA: Diagnosis not present

## 2020-12-06 DIAGNOSIS — Z1152 Encounter for screening for COVID-19: Secondary | ICD-10-CM | POA: Diagnosis not present

## 2020-12-19 DIAGNOSIS — Z1152 Encounter for screening for COVID-19: Secondary | ICD-10-CM | POA: Diagnosis not present

## 2020-12-26 DIAGNOSIS — Z1152 Encounter for screening for COVID-19: Secondary | ICD-10-CM | POA: Diagnosis not present

## 2021-01-02 DIAGNOSIS — Z1152 Encounter for screening for COVID-19: Secondary | ICD-10-CM | POA: Diagnosis not present

## 2021-01-07 DIAGNOSIS — Z1152 Encounter for screening for COVID-19: Secondary | ICD-10-CM | POA: Diagnosis not present

## 2021-01-15 DIAGNOSIS — Z1152 Encounter for screening for COVID-19: Secondary | ICD-10-CM | POA: Diagnosis not present

## 2021-01-22 DIAGNOSIS — Z1152 Encounter for screening for COVID-19: Secondary | ICD-10-CM | POA: Diagnosis not present

## 2021-01-30 DIAGNOSIS — Z1152 Encounter for screening for COVID-19: Secondary | ICD-10-CM | POA: Diagnosis not present

## 2021-02-01 ENCOUNTER — Ambulatory Visit (INDEPENDENT_AMBULATORY_CARE_PROVIDER_SITE_OTHER): Payer: Self-pay

## 2021-02-01 ENCOUNTER — Other Ambulatory Visit: Payer: Self-pay

## 2021-02-01 ENCOUNTER — Ambulatory Visit (HOSPITAL_COMMUNITY)
Admission: EM | Admit: 2021-02-01 | Discharge: 2021-02-01 | Disposition: A | Discharge status: Home / Self Care | Payer: Self-pay | Attending: Emergency Medicine | Admitting: Emergency Medicine

## 2021-02-01 ENCOUNTER — Encounter (HOSPITAL_COMMUNITY): Payer: Self-pay | Admitting: Emergency Medicine

## 2021-02-01 DIAGNOSIS — Z760 Encounter for issue of repeat prescription: Secondary | ICD-10-CM

## 2021-02-01 DIAGNOSIS — R059 Cough, unspecified: Secondary | ICD-10-CM

## 2021-02-01 DIAGNOSIS — J208 Acute bronchitis due to other specified organisms: Secondary | ICD-10-CM

## 2021-02-01 DIAGNOSIS — J019 Acute sinusitis, unspecified: Secondary | ICD-10-CM

## 2021-02-01 DIAGNOSIS — B9689 Other specified bacterial agents as the cause of diseases classified elsewhere: Secondary | ICD-10-CM

## 2021-02-01 MED ORDER — ALBUTEROL SULFATE (2.5 MG/3ML) 0.083% IN NEBU
2.5000 mg | INHALATION_SOLUTION | RESPIRATORY_TRACT | 12 refills | Status: DC | PRN
Start: 2021-02-01 — End: 2022-02-19

## 2021-02-01 MED ORDER — ALBUTEROL SULFATE HFA 108 (90 BASE) MCG/ACT IN AERS: 1.0000 | INHALATION_SPRAY | Freq: Four times a day (QID) | RESPIRATORY_TRACT | 0 refills | Status: DC | PRN

## 2021-02-01 MED ORDER — IPRATROPIUM BROMIDE 0.03 % NA SOLN: 2.0000 | Freq: Two times a day (BID) | NASAL | 12 refills | Status: DC

## 2021-02-01 MED ORDER — AMOXICILLIN-POT CLAVULANATE 875-125 MG PO TABS
1.0000 | ORAL_TABLET | Freq: Two times a day (BID) | ORAL | 0 refills | Status: DC
Start: 2021-02-01 — End: 2021-04-22

## 2021-02-01 MED ORDER — BENZONATATE 100 MG PO CAPS
100.0000 mg | ORAL_CAPSULE | Freq: Three times a day (TID) | ORAL | 0 refills | Status: DC
Start: 2021-02-01 — End: 2022-06-14

## 2021-02-01 MED ORDER — ALBUTEROL SULFATE (2.5 MG/3ML) 0.083% IN NEBU
2.5000 mg | INHALATION_SOLUTION | RESPIRATORY_TRACT | 12 refills | Status: DC | PRN
Start: 2021-02-01 — End: 2021-02-01

## 2021-02-01 NOTE — ED Triage Notes (Signed)
A month ago, tested positive for covid.  Patient now having cough, dark green phlegm and sob.  Patient works at a nursing home.

## 2021-02-01 NOTE — Discharge Instructions (Signed)
°  Please take antibiotics as prescribed and be sure to complete entire course even if you start to feel better to ensure infection does not come back.  Call to schedule a follow up with primary care in 1 week if not improving.

## 2021-02-01 NOTE — ED Provider Notes (Signed)
[facility]    CSN: [id number] Arrival date & time: 02/01/21  1521      History   Chief Complaint Chief Complaint  Patient presents with   Cough    HPI [name] is a 60 y.o. female.   HPI [name] is a 60 y.o. female presenting to UC with c/o continued cough and congestion after dx of COVID 1 month ago.  Associated dark green phlegm and SOB.  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 Denies fever, chills, n/v/d. Hx of asthma.  She is requesting a refill of her albuterol.  Denies SOB or chest pain at this time.    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 (negative margins & nodes)   History of cellulitis    post op 2004 vulvectomy surgery --  left thigh celluitis-- resolved   History of cervical cancer    2002  carcinoma in situ  s/p  conization   History of peptic ulcer    age 64's   due to drinking alcohol --  resolved when quit   Pelvic cramping    PMB (postmenopausal bleeding)     Patient Active Problem List   Diagnosis Date Noted   Hot flashes, menopausal 01/19/2016   Shoulder pain, left 01/19/2016   Postmenopausal bleeding 09/17/2015   Reactive airway disease 05/01/2015    Past Surgical History:  Procedure Laterality Date   DILATION AND CURETTAGE OF UTERUS N/A 09/25/2015   Procedure: DILATATION AND CURETTAGE;  Surgeon: [name], MD;  Location: [facility];  Service: Gynecology;  Laterality: N/A;   EUA/  COLD KNIFE CERVICAL CONIZATION/  ENDOCERVICAL CURETTAGE/  PARTIAL LEFT VULVECTOMY  04/10/2000   LEFT INGUINAL LYMPHADENECTOMY/  LEFT MODIFIED RADICAL VULVECTOMY/  RIGHT SIMPLE VULECTOMY/   EXCISION PERIANAL LESIONS  06/26/2002    OB History   No obstetric history on file.      Home Medications    Prior to Admission medications   Medication Sig Start Date End Date Taking? Authorizing Provider  amoxicillin-clavulanate (AUGMENTIN) 875-125 MG tablet Take 1 tablet by mouth every 12 (twelve) hours. 02/01/21  Yes Mandisa Persinger O, PA-C  benzonatate (TESSALON) 100 MG capsule Take 1 capsule (100 mg total) by mouth every 8 (eight) hours. 02/01/21  Yes Marvelyn Bouchillon O, PA-C  ipratropium (ATROVENT) 0.03 % nasal spray Place 2 sprays into both nostrils every 12 (twelve) hours. 02/01/21  Yes Tallan Sandoz, Bronwen Betters, PA-C  acetaminophen (TYLENOL) 500 MG tablet Take 1,000 mg by mouth every 6 (six) hours as needed.    [provider]  albuterol (PROVENTIL) (2.5 MG/3ML) 0.083% nebulizer solution Take 3 mLs (2.5 mg total) by nebulization every 4 (four) hours as needed for wheezing or shortness of breath. 02/01/21   Noe Gens, PA-C  albuterol (VENTOLIN HFA) 108 (90 Base) MCG/ACT inhaler Inhale 1-2 puffs into the lungs every 6 (six) hours as needed for wheezing or shortness of breath. 02/01/21   Noe Gens, PA-C  fluticasone (FLONASE) 50 MCG/ACT nasal spray Place 2 sprays into both nostrils daily. 04/15/17   Cathlean Sauer  V, PA-C  ibuprofen (ADVIL) 600 MG tablet Take 1 tablet (600 mg total) by mouth every 6 (six) hours as needed. 08/16/19   Faustino Congress, NP  methocarbamol (ROBAXIN) 500 MG tablet Take 1 tablet (500 mg total) by mouth every 8 (eight) hours as needed for muscle spasms. Patient not taking: Reported on 02/01/2021 10/05/20   Loni Beckwith, PA-C    Family History Family History  Problem Relation Age of Onset   Hypertension Mother    Heart attack Father    Diabetes Maternal Grandmother    Cancer Maternal Aunt     Social History Social History   Tobacco Use   Smoking status: Every Day    Packs/day: 0.50    Years: 32.00    Pack years: 16.00    Types: Cigarettes    Smokeless tobacco: Never   Tobacco comments:    pt is working on [employer] since 2016  Vaping Use   Vaping Use: Never used  Substance Use Topics   Alcohol use: Yes    Comment: occasional beer   Drug use: No     Allergies   Patient has no known allergies.   Review of Systems Review of Systems  Constitutional:  Negative for chills and fever.  HENT:  Positive for congestion, sinus pressure, sinus pain and sore throat. Negative for ear pain, trouble swallowing and voice change.   Respiratory:  Positive for cough and chest tightness. Negative for shortness of breath.   Cardiovascular:  Negative for chest pain and palpitations.  Gastrointestinal:  Negative for abdominal pain, diarrhea, nausea and vomiting.  Musculoskeletal:  Negative for arthralgias, back pain and myalgias.  Skin:  Negative for rash.  Neurological:  Positive for headaches. Negative for dizziness and light-headedness.  All other systems reviewed and are negative.   Physical Exam Triage Vital Signs ED Triage Vitals  Enc Vitals Group     BP 02/01/21 1608 121/84     Pulse Rate 02/01/21 1608 73     Resp 02/01/21 1608 (!) 22     Temp 02/01/21 1608 99.5 F (37.5 C)     Temp Source 02/01/21 1608 Oral     SpO2 02/01/21 1608 99 %     Weight --      Height --      Head Circumference --      Peak Flow --      Pain Score 02/01/21 1606 8     Pain Loc --      Pain Edu? --      Excl. in [place]? --    No data found.  Updated Vital Signs BP 121/84 (BP Location: Left Arm)    Pulse 73    Temp 99.5 F (37.5 C) (Oral)    Resp (!) 22    LMP 04/24/2015 (Approximate)    SpO2 99%   Visual Acuity Right Eye Distance:   Left Eye Distance:   Bilateral Distance:    Right Eye Near:   Left Eye Near:    Bilateral Near:     Physical Exam Vitals and nursing note reviewed.  Constitutional:      General: She is not in acute distress.    Appearance: Normal appearance. She is well-developed. She is not ill-appearing, toxic-appearing  or diaphoretic.  HENT:     Head: Normocephalic and atraumatic.     Right Ear: Tympanic membrane and ear canal normal.     Left Ear: Tympanic membrane and ear canal normal.     Nose:  Congestion present.     Right Sinus: Maxillary sinus tenderness and frontal sinus tenderness present.     Left Sinus: Maxillary sinus tenderness and frontal sinus tenderness present.     Mouth/Throat:     Lips: Pink.     Mouth: Mucous membranes are moist.     Pharynx: Oropharynx is clear. Uvula midline. No pharyngeal swelling, oropharyngeal exudate, posterior oropharyngeal erythema or uvula swelling.  Cardiovascular:     Rate and Rhythm: Normal rate and regular rhythm.  Pulmonary:     Effort: Pulmonary effort is normal. No respiratory distress.     Breath sounds: Normal breath sounds. No stridor. No wheezing, rhonchi or rales.     Comments: Intermittent productive cough during exam. No evidence of respiratory distress.  Musculoskeletal:        General: Normal range of motion.     Cervical back: Normal range of motion and neck supple. No rigidity.  Lymphadenopathy:     Cervical: No cervical adenopathy.  Skin:    General: Skin is warm and dry.  Neurological:     Mental Status: She is alert and oriented to person, place, and time.  Psychiatric:        Behavior: Behavior normal.     UC Treatments / Results  Labs (all labs ordered are listed, but only abnormal results are displayed) Labs Reviewed - No data to display  EKG   Radiology DG Chest 2 View  Result Date: 02/01/2021 CLINICAL DATA:  COVID 1 month ago.  Continued cough. EXAM: CHEST - 2 VIEW COMPARISON:  Chest x-ray 10/05/2020 FINDINGS: The heart size and mediastinal contours are within normal limits. Both lungs are clear. The visualized skeletal structures are unremarkable. IMPRESSION: No active cardiopulmonary disease. Electronically Signed   By: Ronney Asters M.D.   On: 02/01/2021 16:27    Procedures Procedures (including critical care  time)  Medications Ordered in UC Medications - No data to display  Initial Impression / Assessment and Plan / UC Course  I have reviewed the triage vital signs and the nursing notes.  Pertinent labs & imaging results that were available during my care of the patient were reviewed by me and considered in my medical decision making (see chart for details).     O2 Sat 99% on RA and HR of 73bpm Pt appears well, non-toxic CXR: no acute findings, however, due to ongoing duration of cough, congestion, along with sinus pain and pressure, will tx with Augmentin for secondary bacterial infection from COVID dx 1 month ago. Albuterol refilled Encouraged close f/u with PCP AVS and work note provided  Final Clinical Impressions(s) / UC Diagnoses   Final diagnoses:  Acute bacterial bronchitis  Medication refill  Acute bacterial sinusitis     Discharge Instructions       Please take antibiotics as prescribed and be sure to complete entire course even if you start to feel better to ensure infection does not come back.  Call to schedule a follow up with primary care in 1 week if not improving.      ED Prescriptions     Medication Sig Dispense Auth. Provider   amoxicillin-clavulanate (AUGMENTIN) 875-125 MG tablet Take 1 tablet by mouth every 12 (twelve) hours. 14 tablet Kailana Benninger O, PA-C   benzonatate (TESSALON) 100 MG capsule Take 1 capsule (100 mg total) by mouth every 8 (eight) hours. 21 capsule Gerarda Fraction, Ilya Neely O, PA-C   ipratropium (ATROVENT) 0.03 % nasal spray Place 2 sprays into both nostrils every 12 (  twelve) hours. 30 mL Arda Daggs O, PA-C   albuterol (VENTOLIN HFA) 108 (90 Base) MCG/ACT inhaler Inhale 1-2 puffs into the lungs every 6 (six) hours as needed for wheezing or shortness of breath. 1 each Noe Gens, PA-C   albuterol (PROVENTIL) (2.5 MG/3ML) 0.083% nebulizer solution  (Status: Discontinued) Take 3 mLs (2.5 mg total) by nebulization every 4 (four) hours as needed  for wheezing or shortness of breath. 3 mL Gerarda Fraction, Khambrel Amsden O, PA-C   albuterol (PROVENTIL) (2.5 MG/3ML) 0.083% nebulizer solution Take 3 mLs (2.5 mg total) by nebulization every 4 (four) hours as needed for wheezing or shortness of breath. 3 mL Noe Gens, PA-C      PDMP not reviewed this encounter.   Noe Gens, [place] 02/01/21 1801

## 2021-02-17 DIAGNOSIS — Z1152 Encounter for screening for COVID-19: Secondary | ICD-10-CM | POA: Diagnosis not present

## 2021-02-23 DIAGNOSIS — Z1152 Encounter for screening for COVID-19: Secondary | ICD-10-CM | POA: Diagnosis not present

## 2021-02-28 DIAGNOSIS — Z1152 Encounter for screening for COVID-19: Secondary | ICD-10-CM | POA: Diagnosis not present

## 2021-03-08 DIAGNOSIS — Z1152 Encounter for screening for COVID-19: Secondary | ICD-10-CM | POA: Diagnosis not present

## 2021-03-17 DIAGNOSIS — Z1152 Encounter for screening for COVID-19: Secondary | ICD-10-CM | POA: Diagnosis not present

## 2021-03-20 DIAGNOSIS — Z1152 Encounter for screening for COVID-19: Secondary | ICD-10-CM | POA: Diagnosis not present

## 2021-04-09 DIAGNOSIS — Z1152 Encounter for screening for COVID-19: Secondary | ICD-10-CM | POA: Diagnosis not present

## 2021-04-19 DIAGNOSIS — Z1152 Encounter for screening for COVID-19: Secondary | ICD-10-CM | POA: Diagnosis not present

## 2021-04-20 ENCOUNTER — Encounter: Payer: Self-pay | Admitting: Internal Medicine

## 2021-04-20 NOTE — Progress Notes (Incomplete)
? ?  CC: *** ? ?HPI:[name] is a 61 y.o. female who presents for evaluation of ***. Please see individual problem based A/P for details. ? ?Asthma - ipratropium, albuterol breathing treatment and rescue inhaler ? ?Dilation and curettage of uterus ? ? ?Depression, PHQ-9: ?Based on the patients  score we have ***. ? ?Past Medical History:  ?Diagnosis Date  ?? Anomaly of uterus   ? heterogeneous echotexture of the uterus (per dr Skeet Latch note)  ?? Asthma   ? seasonal and environmental  ?? History of cancer of vulva oncologist-  dr clark-pearson/  dr Skeet Latch  ? 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 History of cellulitis   ? post op 2004 vulvectomy surgery --  left thigh celluitis-- resolved  ?? History of cervical cancer   ? 2002  carcinoma in situ  s/p  conization  ?? History of peptic ulcer   ? age 10's   due to drinking alcohol --  resolved when quit  ?? Pelvic cramping   ?? PMB (postmenopausal bleeding)   ? ?Review of Systems:   ?ROS  ? ?Physical Exam: ?There were no vitals filed for this visit. ? ? ?General: *** ?HEENT: Conjunctiva nl , antiicteric sclerae, moist mucous membranes, no exudate or erythema ?Cardiovascular: Normal rate, regular rhythm.  No murmurs, rubs, or gallops ?Pulmonary : Equal breath sounds, No wheezes, rales, or rhonchi ?Abdominal: soft, nontender,  bowel sounds present ?Ext: No edema in lower extremities, no tenderness to palpation of lower extremities.  ? ?Assessment & Plan:  ? ?See Encounters Tab for problem based charting. ? ?Patient {GC/GE:[id number]::"discussed with","seen with"} Dr. {WEXHB:[id number]::"L. [facility]","Guilloud","Mullen","Narendra","Raines","Vincent","Williams"} ? ?

## 2021-04-22 ENCOUNTER — Other Ambulatory Visit: Payer: Self-pay

## 2021-04-22 ENCOUNTER — Other Ambulatory Visit (HOSPITAL_COMMUNITY): Payer: Self-pay

## 2021-04-22 ENCOUNTER — Encounter: Payer: Self-pay | Admitting: Internal Medicine

## 2021-04-22 ENCOUNTER — Ambulatory Visit (INDEPENDENT_AMBULATORY_CARE_PROVIDER_SITE_OTHER): Payer: Self-pay | Admitting: Internal Medicine

## 2021-04-22 VITALS — BP 111/82 | HR 75 | Temp 98.6°F | Ht 61.0 in | Wt 150.5 lb

## 2021-04-22 DIAGNOSIS — R29898 Other symptoms and signs involving the musculoskeletal system: Secondary | ICD-10-CM

## 2021-04-22 DIAGNOSIS — Z720 Tobacco use: Secondary | ICD-10-CM

## 2021-04-22 DIAGNOSIS — Z8541 Personal history of malignant neoplasm of cervix uteri: Secondary | ICD-10-CM

## 2021-04-22 DIAGNOSIS — M79605 Pain in left leg: Secondary | ICD-10-CM

## 2021-04-22 DIAGNOSIS — M549 Dorsalgia, unspecified: Secondary | ICD-10-CM

## 2021-04-22 DIAGNOSIS — M792 Neuralgia and neuritis, unspecified: Secondary | ICD-10-CM | POA: Insufficient documentation

## 2021-04-22 DIAGNOSIS — N951 Menopausal and female climacteric states: Secondary | ICD-10-CM

## 2021-04-22 DIAGNOSIS — J4540 Moderate persistent asthma, uncomplicated: Secondary | ICD-10-CM

## 2021-04-22 MED ORDER — DULOXETINE HCL 30 MG PO CPEP
30.0000 mg | ORAL_CAPSULE | Freq: Every day | ORAL | 2 refills | Status: DC
  Filled 2021-04-22: qty 30, 30d supply, fill #0

## 2021-04-22 MED ORDER — BUDESONIDE-FORMOTEROL FUMARATE 80-4.5 MCG/ACT IN AERO
2.0000 | INHALATION_SPRAY | Freq: Every day | RESPIRATORY_TRACT | 12 refills | Status: DC
  Filled 2021-04-22: qty 10.2, 60d supply, fill #0

## 2021-04-22 MED ORDER — NICOTINE 21 MG/24HR TD PT24
21.0000 mg | MEDICATED_PATCH | TRANSDERMAL | 11 refills | Status: DC
  Filled 2021-04-22: qty 28, 28d supply, fill #0

## 2021-04-22 NOTE — Assessment & Plan Note (Addendum)
Patient reports continued tobacco use, she has cut down to 2 to 3 packs/week.  She is interested in quitting. ?Patient has extensive pack year smoking history.  She does endorse some weight loss stating that her pants no longer fit as tightly.  She also describes night sweats and hot flashes.  Constellation of symptoms concerning for possible underlying malignancy. ?Approximately 10 pound weight loss past year. ?Although patient needs CT scan, she is without insurance coverage at the moment. CXR ordered for now (due to cost), however, will plan to follow this up with CT scan once patient has insurance. ?

## 2021-04-22 NOTE — Assessment & Plan Note (Signed)
Patient reporting continued hot flashses. She states thay are impacting her ability to fall asleep at night. They have been worsening over the past year as well. She has been on Effexor in the past but does not recall.  ?Concerned night sweats and hot flashes may be related to underlying malignancy given tobacco use and weight loss, however, she will be started on Cymbalta to help with neuropathic pain, which may improve symptoms if related to menopause.  ?

## 2021-04-22 NOTE — Progress Notes (Addendum)
? ?  CC: establish care ? ?HPI:[name] is a 61 y.o. female who presents for evaluation of establish care. Please see individual problem based A/P for details. ? ? ?Depression, PHQ-9: ?Based on the patients  score we have . ? ?Past Medical History:  ?Diagnosis Date  ? Anomaly of uterus   ? heterogeneous echotexture of the uterus (per dr Skeet Latch note)  ? Asthma   ? seasonal and environmental  ? History of cancer of vulva oncologist-  dr clark-pearson/  dr Skeet Latch  ? 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 History of cellulitis   ? post op 2004 vulvectomy surgery --  left thigh celluitis-- resolved  ? History of cervical cancer   ? 2002  carcinoma in situ  s/p  conization  ? History of peptic ulcer   ? age 32's   due to drinking alcohol --  resolved when quit  ? Pelvic cramping   ? PMB (postmenopausal bleeding)   ? ?Review of Systems:   ?Review of Systems  ?Constitutional:  Positive for diaphoresis and weight loss.  ?Respiratory: Negative.    ?Cardiovascular: Negative.   ?Gastrointestinal: Negative.   ?Genitourinary: Negative.   ?Musculoskeletal:  Positive for joint pain.  ?Skin: Negative.   ?Neurological:  Positive for tingling, sensory change and weakness.  ?Psychiatric/Behavioral: Negative.     ? ?Physical Exam: ?Vitals:  ? 04/22/21 0902  ?BP: 111/82  ?Pulse: 75  ?Temp: 98.6 ?F (37 ?C)  ?TempSrc: Oral  ?SpO2: 100%  ?Weight: 150 lb 8 oz (68.3 kg)  ?Height: '5\' 1"'$  (1.549 m)  ? ? ? ?General: alert and oriented, no acute distress ?HEENT: Conjunctiva nl , antiicteric sclerae, moist mucous membranes, no exudate or erythema ?Cardiovascular: Normal rate, regular rhythm.  No murmurs, rubs, or gallops ?Pulmonary : Equal breath sounds, No wheezes, rales, or rhonchi ?Abdominal: soft, nontender,  bowel sounds present ?GU: hyperpigmented spot left vulva, no bleeding or lesions noted inside  vaginal canal or on cervix. ?Ext: No edema in lower extremities, no tenderness to palpation of lower extremities.  ?MSK: point tenderness  in thoracic and lumbar spine.  ?Neuro: Diffuse decrease sensation to gross touch right leg, strength 4+/5. Left leg sensation intact, strength 5/5. No saddle anesthesia.  ? ?Assessment & Plan:  ? ?See Encounters Tab for problem based charting. ? ?Patient discussed with Dr.  Saverio Danker ? ?

## 2021-04-22 NOTE — Assessment & Plan Note (Signed)
Patient reporting numbness in her right leg with burning sensation in thigh. She also endorses 2-3 months of leg weakness. She has tried tylenol and biofreeze, neither of which have provided relief.  ?We will start her on cymbalta to help with neuropathic pain as well as her hot flashes.  ?Will plan for MRI spine once pt has insurance. ?

## 2021-04-22 NOTE — Assessment & Plan Note (Signed)
Patient with point tenderness to thoracic and lumbar spine. She has a history of cervical and vulvar cancer. Additionally, she is reporting night sweats, weight loss concerning for underlying malignancy given her history of tobacco use.  ?Overall concerning for possible metastasis to spine.  ?She is unable to undergo necessary MRI at this time due to lack of insurance and prohibitory cost. Xrays of thoracic and lumbar spine have been ordered. Will plan to follow these up with MRI once she has insurance. ?

## 2021-04-22 NOTE — Assessment & Plan Note (Signed)
Patient reporting that she uses her albuterol inhaler 2-3 times daily she has not been using breathing treatments.  Reports her breathing today is feeling rough.  She does report that she typically uses her inhaler mostly for signs of congestion.  She feels this congestion mostly in the morning, possibly postnasal drip. ?Lungs clear to auscultation ?Patient will benefit from inhaled corticosteroid.  Started Symbicort.  We will continue albuterol. ?

## 2021-04-22 NOTE — Assessment & Plan Note (Signed)
Patient reports shooting leg pain of her left leg. Pain originates in lower back and goes to toes. She denies any current weakness, but will have weakness with pain.  ?Differential includes spinal stenosis. ?No red flag symptoms currently. ?Patient would benefit from MRI, however she is limited by cost. ?Will plan to follow up with MRI when she has insurance. ?

## 2021-04-22 NOTE — Patient Instructions (Addendum)
Dear [name], ? ?Thank you for trusting [place] with your care today. ? ?We discussed your asthma, tobacco use, hot flashes, leg pains, and weight loss. ? ?For your asthma, we will start you on a medication called Symbicort to help control your breathing.  You can also use the albuterol inhaler when you need it. ? ?For your tobacco use, we do recommend that you quit smoking.  To help with that we can offer nicotine patches.  Nicotine gum can also help curb your cravings.  A medication that we can look into in the future once your insurance kicks in is Chantix. ? ?For your hot flashes, night sweats, and weight loss, we are concerned for possible underlying malignancy.  We do recommend that you get imaging of your chest and back.  We will place orders for chest x-rays as well as x-rays of your thoracic and lumbar spine. ?If the night sweats and hot flashes are related to menopause a medication that we are prescribing to help with your burning pain in your leg called Cymbalta will also help this. ? ?Lastly, given your history of bulbar and cervical cancer, we recommend that you see a gynecologist again. ? ?Please follow-up in 1 months. ?

## 2021-04-24 DIAGNOSIS — Z1152 Encounter for screening for COVID-19: Secondary | ICD-10-CM | POA: Diagnosis not present

## 2021-04-27 ENCOUNTER — Other Ambulatory Visit: Payer: Self-pay | Admitting: Obstetrics and Gynecology

## 2021-04-27 DIAGNOSIS — Z1231 Encounter for screening mammogram for malignant neoplasm of breast: Secondary | ICD-10-CM

## 2021-04-27 NOTE — Progress Notes (Addendum)
Internal Medicine Clinic Attending  I saw and evaluated the patient.  I personally confirmed the key portions of the history and exam documented by Dr. Burnice Logan and I reviewed pertinent patient test results.  The assessment, diagnosis, and plan were formulated together and I agree with the documentation in the residents note. Concerning symptoms of weight loss, night sweats, spinal point tenderness, leg weakness/neuropathic symptoms--discussed need for further imaging with patient although limited by no insurance coverage. Discussed cost of XR which patient was going to look into, as well as return/ED precautions if symptoms were acutely worsening. Rapid f/u scheduled, patient reports she will plan to have insurance by then due to starting new job.  Addendum: Called to f/u with patient regarding XR and insurance status, she notes she is unable to afford the XR right now and won't have insurance through her new job for another 4 weeks or so. Has not started Cymbalta, encouraged her to do so. She is going to fill out financial assistance paperwork and drop it at the office, as well as make appointment in 1-2 weeks for f/u. Advised ED f/u for any new/worsened numbness or weakness in the meantime.

## 2021-05-02 DIAGNOSIS — Z1152 Encounter for screening for COVID-19: Secondary | ICD-10-CM | POA: Diagnosis not present

## 2021-05-07 DIAGNOSIS — Z1152 Encounter for screening for COVID-19: Secondary | ICD-10-CM | POA: Diagnosis not present

## 2021-05-15 DIAGNOSIS — Z1152 Encounter for screening for COVID-19: Secondary | ICD-10-CM | POA: Diagnosis not present

## 2021-05-22 DIAGNOSIS — Z1152 Encounter for screening for COVID-19: Secondary | ICD-10-CM | POA: Diagnosis not present

## 2021-05-26 ENCOUNTER — Encounter: Payer: Self-pay | Admitting: Internal Medicine

## 2021-05-31 DIAGNOSIS — Z1152 Encounter for screening for COVID-19: Secondary | ICD-10-CM | POA: Diagnosis not present

## 2021-06-03 ENCOUNTER — Ambulatory Visit (INDEPENDENT_AMBULATORY_CARE_PROVIDER_SITE_OTHER): Payer: Self-pay | Admitting: Internal Medicine

## 2021-06-03 ENCOUNTER — Other Ambulatory Visit: Payer: Self-pay

## 2021-06-03 ENCOUNTER — Encounter: Payer: Self-pay | Admitting: Internal Medicine

## 2021-06-03 DIAGNOSIS — N951 Menopausal and female climacteric states: Secondary | ICD-10-CM

## 2021-06-03 DIAGNOSIS — M79605 Pain in left leg: Secondary | ICD-10-CM

## 2021-06-03 NOTE — Assessment & Plan Note (Signed)
Patient endorses continued sharp pains down both of her legs but states that the right leg has worse pain.  On the right lower extremity the hip is most bothersome, and on the left lower extremity the knee is most bothersome, though both knees do cause her pain.  She struggles with taking stairs due to stiffness in her knees.  She denies weakness or falls though does sometimes feel like her legs are going to give out on her.  The lower extremities do fatigue easily.  She uses Biofreeze and IcyHot which do help her lower extremity discomfort, and as previously noted she has not been taking Cymbalta which was prescribed for neuropathic pain as prescribed. ?Assessment: I am unable to assess if Cymbalta has been helpful with her symptoms given minimal treatment over the last month. ?Plan: I have counseled patient on taking this medication daily as prescribed so that when we follow-up in 1 month we can assess whether or not it is helping her symptoms. ?

## 2021-06-03 NOTE — Progress Notes (Signed)
? ?CC: 1 month follow-up ? ?HPI: ? ?[name] is a 61 y.o. female with past medical history as detailed below who presents today for 1 month follow-up after medication change.  Please see problem based charting for detail assessment and plan. ? ?Past Medical History:  ?Diagnosis Date  ? Anomaly of uterus   ? heterogeneous echotexture of the uterus (per dr Skeet Latch note)  ? Asthma   ? seasonal and environmental  ? History of cancer of vulva oncologist-  dr clark-pearson/  dr Skeet Latch  ? 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 History of cellulitis   ? post op 2004 vulvectomy surgery --  left thigh celluitis-- resolved  ? History of cervical cancer   ? 2002  carcinoma in situ  s/p  conization  ? History of peptic ulcer   ? age 58's   due to drinking alcohol --  resolved when quit  ? Pelvic cramping   ? PMB (postmenopausal bleeding)   ? ?Review of Systems:   ?Review of Systems  ?Constitutional:  Positive for diaphoresis. Negative for weight loss.  ?Gastrointestinal:   ?     Negative for incontinence of stool.  ?Genitourinary:   ?     Negative for urinary incontinence.  ?Musculoskeletal:  Positive for back pain, joint pain and myalgias. Negative for falls.  ?Neurological:  Negative for tingling and focal weakness.  ? ?Physical Exam: ? ?Vitals:  ? 06/03/21 0916  ?BP: 116/82  ?Pulse: 80  ?Temp: 98.1 ?F (36.7 ?C)  ?TempSrc: Oral  ?SpO2: 100%  ?Weight: 149 lb 14.4 oz (68 kg)  ?Height: '5\' 1"'$  (1.549 m)  ? ?Constitutional: Well-appearing female in no acute distress. ?Cardio: Regular rate and rhythm. ?Pulm: Clear to auscultation bilaterally. ?Chest: Negative for mass or lesion at the left mid axillary fold. ?MSK: Negative for extremity edema. ?Skin: Warm and dry. ?Neuro: LE strength is 5 out of 5 without altered sensation.  Normal ambulation. ?Psych: Pleasant mood and affect. ? ?Assessment &  Plan:  ? ?Hot flashes, menopausal ?Patient continues to endorse experiencing hot flashes at this time.  At her last office visit she was started on Cymbalta for neuropathic pain with the hopes that her hot flashes would also improve with the medication.  She has not been consistently taking this medication due to worries regarding side effects, only taking it twice a week or so and this time. ? ?At her last office visit she assured staff that she would be able to get insurance with her job prior to this visit, however that did not work out and she has not filled out paperwork for the orange card yet. ? ?She does have appointments tomorrow for Pap smear and mammogram as part of malignancy work-up initiated at her last visit due to concerns of hot flashes versus night sweats, weight loss, tobacco use history. ? ?Of note she does describe a knot near the axillary fold on the left which waxes and wanes in size.  At this time it is noninflamed and I am unable to confidently palpate it on our exam. ?Assessment: Unfortunately without insurance coverage we have not been able to further work-up concerns of malignancy described at her last visit.  Given minimal therapy with Cymbalta I am unable to assess the efficacy of this medication overall, but specifically with helping her symptoms of  hot flashes. ?Plan: I have counseled the patient to start taking Cymbalta daily as prescribed.  I explained to her that she may see an improvement in her hot flashes with this medication.  We will follow-up in 1 month to see how the medication is helping her symptoms and pursue further malignancy work-up as indicated.  If mammogram is negative and left axillary region "knot"persist, I would recommend referral for ultrasound of the area. ? ?Leg pain, left ?Patient endorses continued sharp pains down both of her legs but states that the right leg has worse pain.  On the right lower extremity the hip is most bothersome, and on the left lower  extremity the knee is most bothersome, though both knees do cause her pain.  She struggles with taking stairs due to stiffness in her knees.  She denies weakness or falls though does sometimes feel like her legs are going to give out on her.  The lower extremities do fatigue easily.  She uses Biofreeze and IcyHot which do help her lower extremity discomfort, and as previously noted she has not been taking Cymbalta which was prescribed for neuropathic pain as prescribed. ?Assessment: I am unable to assess if Cymbalta has been helpful with her symptoms given minimal treatment over the last month. ?Plan: I have counseled patient on taking this medication daily as prescribed so that when we follow-up in 1 month we can assess whether or not it is helping her symptoms. ? ? ?See Encounters Tab for problem based charting. ? ?Patient discussed with Dr.  Saverio Danker ? ?

## 2021-06-03 NOTE — Patient Instructions (Signed)
It was nice seeing you today! Thank you for choosing [facility] for your Primary Care.  ?  ?Today we talked about:  ? ?1.Your back and leg pains: I'm sorry that you are still experiencing these pains! I understand that you have been hesitant about starting the new medicine, duloxetine, but I would like you to try taking it every day for the next month to see if it improves your symptoms. Like we discussed, you're on a low dose and there is room to go up if needed. ? ?2.Hot flashes: The medicine that we started you on for your leg pains may also help with your hot flashes. Please let me know if the medicine helps with these episodes once you start regularly taking the medicine. ? ?I would like to see you back in 1 month to see how the medication is helping you. At that time I hope that you will have insurance so that we can do any other tests if they are necessary. ? ? ?Remember: If you have any questions or concerns, please call our clinic at [phone] between 9am-5pm and after hours call [phone] and ask for the internal medicine resident on call. If you feel you are having a medical emergency please call 911. ? ?Farrel Gordon, DO ? ?

## 2021-06-03 NOTE — Assessment & Plan Note (Addendum)
Patient continues to endorse experiencing hot flashes at this time.  At her last office visit she was started on Cymbalta for neuropathic pain with the hopes that her hot flashes would also improve with the medication.  She has not been consistently taking this medication due to worries regarding side effects, only taking it twice a week or so and this time. ? ?At her last office visit she assured staff that she would be able to get insurance with her job prior to this visit, however that did not work out and she has not filled out paperwork for the orange card yet. ? ?She does have appointments tomorrow for Pap smear and mammogram as part of malignancy work-up initiated at her last visit due to concerns of hot flashes versus night sweats, weight loss, tobacco use history. ? ?Of note she does describe a knot near the axillary fold on the left which waxes and wanes in size.  At this time it is noninflamed and I am unable to confidently palpate it on our exam. ?Assessment: Unfortunately without insurance coverage we have not been able to further work-up concerns of malignancy described at her last visit.  Given minimal therapy with Cymbalta I am unable to assess the efficacy of this medication overall, but specifically with helping her symptoms of hot flashes. ?Plan: I have counseled the patient to start taking Cymbalta daily as prescribed.  I explained to her that she may see an improvement in her hot flashes with this medication.  We will follow-up in 1 month to see how the medication is helping her symptoms and pursue further malignancy work-up as indicated.  If mammogram is negative and left axillary region "knot"persist, I would recommend referral for ultrasound of the area. ?

## 2021-06-04 ENCOUNTER — Ambulatory Visit
Admission: RE | Admit: 2021-06-04 | Discharge: 2021-06-04 | Disposition: A | Discharge status: Home / Self Care | Payer: No Typology Code available for payment source | Source: Ambulatory Visit | Attending: Obstetrics and Gynecology | Admitting: Obstetrics and Gynecology

## 2021-06-04 ENCOUNTER — Ambulatory Visit: Payer: Self-pay | Admitting: *Deleted

## 2021-06-04 VITALS — BP 120/82 | Wt 146.6 lb

## 2021-06-04 DIAGNOSIS — Z1211 Encounter for screening for malignant neoplasm of colon: Secondary | ICD-10-CM

## 2021-06-04 DIAGNOSIS — Z1231 Encounter for screening mammogram for malignant neoplasm of breast: Secondary | ICD-10-CM

## 2021-06-04 DIAGNOSIS — Z01419 Encounter for gynecological examination (general) (routine) without abnormal findings: Secondary | ICD-10-CM

## 2021-06-04 NOTE — Progress Notes (Signed)
[name] is a 61 y.o. No obstetric history on file. female who presents to [facility] clinic today with no complaints.  ?  ?Pap Smear: Pap smear completed today. Last Pap smear was 05/08/2015 at [facility] and was normal with negative HPV. Per patient has history of an abnormal Pap smear 01/23/2002 that was LSIL that a colposcopy was completed for follow up 03/27/2002 that showed squamous carcinoma in situ that a LEEP was completed for follow up 04/10/2002. 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 Last Pap smear result is available in Epic. ?  ?Physical exam: ?Breasts ?Breasts symmetrical. No skin abnormalities bilateral breasts. No nipple retraction bilateral breasts. No nipple discharge bilateral breasts. No lymphadenopathy. No lumps palpated bilateral breasts. No complaints of pain or tenderness on exam.    ?  ?Pelvic/Bimanual ?Ext Genitalia ?No lesions, no swelling and no discharge observed on external genitalia. Patient has a history of a vulvar excision due to history of squamous carcinoma in situ.      ?  ?Vagina ?Vagina pink and normal texture. No lesions or discharge observed in vagina.      ?  ?Cervix ?Cervix is present. Cervix pink and of normal texture. No discharge observed.  ?  ?Uterus ?Uterus is present and palpable. Uterus in normal position and normal size.      ?  ?Adnexae ?Bilateral ovaries present and palpable. No tenderness on palpation.       ?  ?Rectovaginal ?No rectal exam completed today since patient had no rectal complaints. No skin abnormalities observed on exam.   ?  ?Smoking History: ?Patient is a current smoker. Discussed smoking cessation with patient and referred to the [facility] Quitline. Referred patient to the free lung cancer screening due to her greater than 30 pack history of smoking. ?  ?Patient Navigation: ?Patient education provided. Access to services provided for patient through [facility] program.  ? ?Colorectal Cancer  Screening: ?Per patient has never had colonoscopy completed. FIT Test given to patient to complete. No complaints today.  ?  ?Breast and Cervical Cancer Risk Assessment: ?Patient has family history of a maternal aunt having breast cancer. Patient has no known genetic mutations or history of radiation treatment to the chest before age 60. Patient has history of cervical dysplasia. Patient has no history of being immunocompromised or DES exposure in-utero. ? ?Risk Assessment   ? ? Risk Scores   ? ?   06/04/2021  ? Last edited by: Amanda Revel, LPN  ? 5-year risk: 1.4 %  ? Lifetime risk: 7.3 %  ? ?  ?  ? ?  ? ? ?A: ?[facility] exam with pap smear ?No complaints. ? ?P: ?Referred [facility] to the [facility] for a screening mammogram on mobile unit. Appointment scheduled Thursday, June 04, 2021 at 0930. ? ?Amanda Parish, RN ?06/04/2021 8:31 AM   ?

## 2021-06-04 NOTE — Patient Instructions (Addendum)
Explained breast self awareness with [name]. Pap smear completed today. Let patient know that next Pap smear will be based on the result due to her history. Referred patient to the [facility] for a screening mammogram on mobile unit. Appointment scheduled Thursday, June 04, 2021 at 0930. Patient aware of appointment and will be there. Let patient know will follow up with her within the next couple weeks with results of her Pap smear by phone. Informed [facility] that [facility] will [facility] with her within the next couple of weeks with results of her mammogram by letter or phone. Discussed smoking cessation with patient and referred to the [facility] Quitline. [name] verbalized understanding. ? ?[name], Arvil Chaco, RN ?8:31 AM ? ? ? ? ?

## 2021-06-06 DIAGNOSIS — Z1152 Encounter for screening for COVID-19: Secondary | ICD-10-CM | POA: Diagnosis not present

## 2021-06-08 NOTE — Addendum Note (Signed)
Addended byCharise Killian on: 06/08/2021 04:09 PM ? ? Modules accepted: Level of Service ? ?

## 2021-06-08 NOTE — Progress Notes (Addendum)
Internal Medicine Clinic Attending ? ?Case discussed with Dr. Marlou Sa  At the time of the visit.  We reviewed the resident?s history and exam and pertinent patient test results.  I agree with the assessment, diagnosis, and plan of care documented in the resident?s note. Unable to obtain spinal imaging as previously ordered due to insurance coverage. Would continue to discuss this if indicated based on symptoms/exam if patient is able/insurance availability allows, continued weight loss noted. Mammogram/Pap/FIT pending per outreach clinic. ?

## 2021-06-10 ENCOUNTER — Telehealth: Payer: Self-pay

## 2021-06-10 LAB — CYTOLOGY - PAP
Adequacy: ABSENT
Comment: NEGATIVE
Diagnosis: NEGATIVE
High risk HPV: NEGATIVE

## 2021-06-10 NOTE — Telephone Encounter (Signed)
Patient informed negative Pap/HPV results, repeat pap in 5 years. Patient verbalized understanding.  

## 2021-06-12 DIAGNOSIS — Z1152 Encounter for screening for COVID-19: Secondary | ICD-10-CM | POA: Diagnosis not present

## 2021-06-12 LAB — FECAL OCCULT BLOOD, IMMUNOCHEMICAL: Fecal Occult Bld: NEGATIVE

## 2021-06-15 ENCOUNTER — Telehealth: Payer: Self-pay

## 2021-06-15 NOTE — Telephone Encounter (Signed)
Patient informed negative Pap/HPV and FIT test results, next pap smear due in 3 years. Patient verbalized understanding.  ?

## 2021-06-20 DIAGNOSIS — Z1152 Encounter for screening for COVID-19: Secondary | ICD-10-CM | POA: Diagnosis not present

## 2021-06-26 DIAGNOSIS — Z1152 Encounter for screening for COVID-19: Secondary | ICD-10-CM | POA: Diagnosis not present

## 2021-07-03 DIAGNOSIS — Z1152 Encounter for screening for COVID-19: Secondary | ICD-10-CM | POA: Diagnosis not present

## 2021-07-10 DIAGNOSIS — Z1152 Encounter for screening for COVID-19: Secondary | ICD-10-CM | POA: Diagnosis not present

## 2021-08-14 ENCOUNTER — Ambulatory Visit (HOSPITAL_COMMUNITY)
Admission: EM | Admit: 2021-08-14 | Discharge: 2021-08-14 | Disposition: A | Discharge status: Home / Self Care | Payer: Commercial Managed Care - HMO | Attending: Student | Admitting: Student

## 2021-08-14 ENCOUNTER — Encounter (HOSPITAL_COMMUNITY): Payer: Self-pay | Admitting: Emergency Medicine

## 2021-08-14 DIAGNOSIS — J441 Chronic obstructive pulmonary disease with (acute) exacerbation: Secondary | ICD-10-CM

## 2021-08-14 DIAGNOSIS — J42 Unspecified chronic bronchitis: Secondary | ICD-10-CM

## 2021-08-14 MED ORDER — AMOXICILLIN-POT CLAVULANATE 875-125 MG PO TABS: 1.0000 | ORAL_TABLET | Freq: Two times a day (BID) | ORAL | 0 refills | Status: DC

## 2021-08-14 MED ORDER — ALBUTEROL SULFATE HFA 108 (90 BASE) MCG/ACT IN AERS: 1.0000 | INHALATION_SPRAY | Freq: Four times a day (QID) | RESPIRATORY_TRACT | 0 refills | Status: DC | PRN

## 2021-08-14 NOTE — ED Provider Notes (Signed)
[place]    CSN: [id number] Arrival date & time: 08/14/21  1337      History   Chief Complaint Chief Complaint  Patient presents with   Cough   Diarrhea    HPI [name] is a 61 y.o. female presenting with cough x1 week. History COPD per pt, she takes daily symbicort 2 puffs qd and albuterol prn (which she has not been using recently). States cough is productive of green sputum, and she is experiencing dyspnea on exertion and wheezing. She is a every day smoker.   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 (negative margins & nodes)   History of cellulitis    post op 2004 vulvectomy surgery --  left thigh celluitis-- resolved   History of cervical cancer    2002  carcinoma in situ  s/p  conization   History of peptic ulcer    age 56's   due to drinking alcohol --  resolved when quit   Pelvic cramping    PMB (postmenopausal bleeding)     Patient Active Problem List   Diagnosis Date Noted   History of cervical cancer 04/22/2021   Tobacco abuse 04/22/2021   Spine pain, multilevel 04/22/2021   Leg pain, left 04/22/2021   Neuropathic pain, leg, right 04/22/2021   Hot flashes, menopausal 01/19/2016   Shoulder pain, left 01/19/2016   Postmenopausal bleeding 09/17/2015   Reactive airway disease 05/01/2015    Past Surgical History:  Procedure Laterality Date   DILATION AND CURETTAGE OF UTERUS N/A 09/25/2015   Procedure: DILATATION AND CURETTAGE;  Surgeon: [name], MD;  Location: [place];  Service: Gynecology;  Laterality: N/A;   EUA/  COLD KNIFE CERVICAL CONIZATION/  ENDOCERVICAL CURETTAGE/   PARTIAL LEFT VULVECTOMY  04/10/2000   LEFT INGUINAL LYMPHADENECTOMY/  LEFT MODIFIED RADICAL VULVECTOMY/  RIGHT SIMPLE VULECTOMY/  EXCISION PERIANAL LESIONS  06/26/2002    OB History   No obstetric history on file.      Home Medications    Prior to Admission medications   Medication Sig Start Date End Date Taking? Authorizing Provider  albuterol (VENTOLIN HFA) 108 (90 Base) MCG/ACT inhaler Inhale 1-2 puffs into the lungs every 6 (six) hours as needed for wheezing or shortness of breath. 08/14/21  Yes Hazel Sams, PA-C  amoxicillin-clavulanate (AUGMENTIN) 875-125 MG tablet Take 1 tablet by mouth every 12 (twelve) hours. 08/14/21  Yes Hazel Sams, PA-C  acetaminophen (TYLENOL) 500 MG tablet Take 1,000 mg by mouth every 6 (six) hours as needed.    [provider]  albuterol (PROVENTIL) (2.5 MG/3ML) 0.083% nebulizer solution Take 3 mLs (2.5 mg total) by nebulization every 4 (four) hours as needed for wheezing or shortness of breath. 02/01/21   Noe Gens, PA-C  benzonatate (TESSALON) 100 MG capsule Take 1 capsule (100 mg total) by mouth every 8 (eight) hours. 02/01/21   Noe Gens, PA-C  budesonide-formoterol (SYMBICORT) 80-4.5 MCG/ACT inhaler Inhale 2 puffs into the lungs daily. 04/22/21   Delene Ruffini, MD  DULoxetine (CYMBALTA) 30 MG capsule Take 1  capsule (30 mg total) by mouth daily. 04/22/21   Delene Ruffini, MD  fluticasone (FLONASE) 50 MCG/ACT nasal spray Place 2 sprays into both nostrils daily. 04/15/17   Tasia Catchings, Amy V, PA-C  ibuprofen (ADVIL) 600 MG tablet Take 1 tablet (600 mg total) by mouth every 6 (six) hours as needed. 08/16/19   Faustino Congress, NP  ipratropium (ATROVENT) 0.03 % nasal spray Place 2 sprays into both nostrils every 12 (twelve) hours. 02/01/21   Noe Gens, PA-C  nicotine (NICODERM CQ - DOSED IN MG/24 HOURS) 21 mg/24hr patch Place 1 patch (21 mg total) onto the skin daily. 04/22/21   Delene Ruffini, MD    Family History Family History   Problem Relation Age of Onset   Hypertension Mother    Heart attack Father    Breast cancer Maternal Aunt    Cancer Maternal Aunt    Diabetes Maternal Grandmother     Social History Social History   Tobacco Use   Smoking status: Every Day    Packs/day: 0.50    Years: 32.00    Total pack years: 16.00    Types: Cigarettes   Smokeless tobacco: Never   Tobacco comments:    pt is working on [employer] since 2016  Vaping Use   Vaping Use: Never used  Substance Use Topics   Alcohol use: Yes    Comment: occasional beer   Drug use: No     Allergies   Patient has no known allergies.   Review of Systems Review of Systems  Constitutional:  Negative for appetite change, chills and fever.  HENT:  Negative for congestion, ear pain, rhinorrhea, sinus pressure, sinus pain and sore throat.   Eyes:  Negative for redness and visual disturbance.  Respiratory:  Positive for cough. Negative for chest tightness, shortness of breath and wheezing.   Cardiovascular:  Negative for chest pain and palpitations.  Gastrointestinal:  Negative for abdominal pain, constipation, diarrhea, nausea and vomiting.  Genitourinary:  Negative for dysuria, frequency and urgency.  Musculoskeletal:  Negative for myalgias.  Neurological:  Negative for dizziness, weakness and headaches.  Psychiatric/Behavioral:  Negative for confusion.   All other systems reviewed and are negative.    Physical Exam Triage Vital Signs ED Triage Vitals  Enc Vitals Group     BP 08/14/21 1444 111/77     Pulse Rate 08/14/21 1444 96     Resp 08/14/21 1444 19     Temp 08/14/21 1444 99 F (37.2 C)     Temp Source 08/14/21 1444 Oral     SpO2 08/14/21 1444 97 %     Weight --      Height --      Head Circumference --      Peak Flow --      Pain Score 08/14/21 1443 4     Pain Loc --      Pain Edu? --      Excl. in [place]? --    No data found.  Updated Vital Signs BP 111/77 (BP Location: Right Arm)   Pulse 96   Temp 99 F  (37.2 C) (Oral)   Resp 19   LMP 04/24/2015 (Approximate)   SpO2 97%   Visual Acuity Right Eye Distance:   Left Eye Distance:   Bilateral Distance:    Right Eye Near:   Left Eye Near:    Bilateral Near:     Physical Exam Vitals reviewed.  Constitutional:      General: She is not  in acute distress.    Appearance: Normal appearance. She is not ill-appearing.     Comments: Resting comfortably and nontoxic appearing. Ambulates into room unassisted.   HENT:     Head: Normocephalic and atraumatic.     Right Ear: Tympanic membrane, ear canal and external ear normal. No tenderness. No middle ear effusion. There is no impacted cerumen. Tympanic membrane is not perforated, erythematous, retracted or bulging.     Left Ear: Tympanic membrane, ear canal and external ear normal. No tenderness.  No middle ear effusion. There is no impacted cerumen. Tympanic membrane is not perforated, erythematous, retracted or bulging.     Nose: Nose normal. No congestion.     Mouth/Throat:     Mouth: Mucous membranes are moist.     Pharynx: Uvula midline. No oropharyngeal exudate or posterior oropharyngeal erythema.  Eyes:     Extraocular Movements: Extraocular movements intact.     Pupils: Pupils are equal, round, and reactive to light.  Cardiovascular:     Rate and Rhythm: Normal rate and regular rhythm.     Heart sounds: Normal heart sounds.  Pulmonary:     Effort: Pulmonary effort is normal.     Breath sounds: Wheezing present. No decreased breath sounds, rhonchi or rales.     Comments: Scattered expiratory wheezes  Abdominal:     Palpations: Abdomen is soft.     Tenderness: There is no abdominal tenderness. There is no guarding or rebound.  Lymphadenopathy:     Cervical: No cervical adenopathy.     Right cervical: No superficial cervical adenopathy.    Left cervical: No superficial cervical adenopathy.  Neurological:     General: No focal deficit present.     Mental Status: She is alert and  oriented to person, place, and time.  Psychiatric:        Mood and Affect: Mood normal.        Behavior: Behavior normal.        Thought Content: Thought content normal.        Judgment: Judgment normal.      UC Treatments / Results  Labs (all labs ordered are listed, but only abnormal results are displayed) Labs Reviewed - No data to display  EKG   Radiology No results found.  Procedures Procedures (including critical care time)  Medications Ordered in UC Medications - No data to display  Initial Impression / Assessment and Plan / UC Course  I have reviewed the triage vital signs and the nursing notes.  Pertinent labs & imaging results that were available during my care of the patient were reviewed by me and considered in my medical decision making (see chart for details).     This patient is a very pleasant 61 y.o. year old female presenting with COPD exacerbation. Afebrile, nontachy. Scattered wheezes throughout but no focal consolidation. She is oxygenating comfortably on room air and is nontoxic appearing. I refilled her albuterol inhaler to use prn, and she will continue symbicort daily as prescribed. Augmentin sent today. ED return precautions discussed. Patient verbalizes understanding and agreement. -Coding Level 4 for acute exacerbation of chronic condition and prescription drug management.  Final Clinical Impressions(s) / UC Diagnoses   Final diagnoses:  Acute exacerbation of chronic bronchitis (HCC)     Discharge Instructions      -Albuterol inhaler as needed for cough, wheezing, shortness of breath, 1 to 2 puffs every 4-6 hours as needed. -Continue daily symbicort  -Continue over-the-counter medications if they're helping -Start  the antibiotic-Augmentin (amoxicillin-clavulanate), 1 pill every 12 hours for 7 days.  You can take this with food like with breakfast and dinner. -Follow-up if symptoms worsen/persist    ED Prescriptions     Medication Sig  Dispense Auth. Provider   albuterol (VENTOLIN HFA) 108 (90 Base) MCG/ACT inhaler Inhale 1-2 puffs into the lungs every 6 (six) hours as needed for wheezing or shortness of breath. 1 each Hazel Sams, PA-C   amoxicillin-clavulanate (AUGMENTIN) 875-125 MG tablet Take 1 tablet by mouth every 12 (twelve) hours. 14 tablet Hazel Sams, PA-C      PDMP not reviewed this encounter.   Hazel Sams, PA-C 08/14/21 1537

## 2021-08-14 NOTE — Discharge Instructions (Signed)
-  Albuterol inhaler as needed for cough, wheezing, shortness of breath, 1 to 2 puffs every 4-6 hours as needed. -Continue daily symbicort  -Continue over-the-counter medications if they're helping -Start the antibiotic-Augmentin (amoxicillin-clavulanate), 1 pill every 12 hours for 7 days.  You can take this with food like with breakfast and dinner. -Follow-up if symptoms worsen/persist

## 2021-08-14 NOTE — ED Triage Notes (Signed)
Pt reports cough for over week that is productive with greenish phlegm. Reports got switched to Symibcort in haler but makes to jittery.  Pt had diarrhea since last night.

## 2022-02-19 ENCOUNTER — Ambulatory Visit
Admission: RE | Admit: 2022-02-19 | Discharge: 2022-02-19 | Disposition: A | Discharge status: Home / Self Care | Payer: Commercial Managed Care - HMO | Source: Ambulatory Visit | Attending: Urgent Care | Admitting: Urgent Care

## 2022-02-19 ENCOUNTER — Ambulatory Visit (INDEPENDENT_AMBULATORY_CARE_PROVIDER_SITE_OTHER): Payer: No Typology Code available for payment source

## 2022-02-19 VITALS — BP 129/84 | HR 82 | Temp 98.9°F | Resp 18

## 2022-02-19 DIAGNOSIS — J4540 Moderate persistent asthma, uncomplicated: Secondary | ICD-10-CM | POA: Diagnosis not present

## 2022-02-19 DIAGNOSIS — F17210 Nicotine dependence, cigarettes, uncomplicated: Secondary | ICD-10-CM | POA: Diagnosis not present

## 2022-02-19 DIAGNOSIS — B9689 Other specified bacterial agents as the cause of diseases classified elsewhere: Secondary | ICD-10-CM | POA: Diagnosis not present

## 2022-02-19 DIAGNOSIS — M4722 Other spondylosis with radiculopathy, cervical region: Secondary | ICD-10-CM | POA: Insufficient documentation

## 2022-02-19 DIAGNOSIS — J40 Bronchitis, not specified as acute or chronic: Secondary | ICD-10-CM

## 2022-02-19 DIAGNOSIS — J329 Chronic sinusitis, unspecified: Secondary | ICD-10-CM

## 2022-02-19 DIAGNOSIS — M5412 Radiculopathy, cervical region: Secondary | ICD-10-CM | POA: Diagnosis not present

## 2022-02-19 DIAGNOSIS — Z1152 Encounter for screening for COVID-19: Secondary | ICD-10-CM | POA: Insufficient documentation

## 2022-02-19 MED ORDER — AMOXICILLIN-POT CLAVULANATE 875-125 MG PO TABS: 1.0000 | ORAL_TABLET | Freq: Two times a day (BID) | ORAL | 0 refills | Status: DC

## 2022-02-19 MED ORDER — PREDNISONE 50 MG PO TABS: 50.0000 mg | ORAL_TABLET | Freq: Every day | ORAL | 0 refills | Status: DC

## 2022-02-19 MED ORDER — TIZANIDINE HCL 4 MG PO TABS: 4.0000 mg | ORAL_TABLET | Freq: Every day | ORAL | 0 refills | Status: DC

## 2022-02-19 MED ORDER — PROMETHAZINE-DM 6.25-15 MG/5ML PO SYRP: 5.0000 mL | ORAL_SOLUTION | Freq: Three times a day (TID) | ORAL | 0 refills | Status: DC | PRN

## 2022-02-19 MED ORDER — ALBUTEROL SULFATE HFA 108 (90 BASE) MCG/ACT IN AERS: 1.0000 | INHALATION_SPRAY | Freq: Four times a day (QID) | RESPIRATORY_TRACT | 0 refills | Status: DC | PRN

## 2022-02-19 NOTE — ED Triage Notes (Addendum)
Pt c/o cough, head/chest congestion x 1 week-also c/o tingling/numbness to LUE x 1 week-also c/o bilat LE pain x "months"-denies injury

## 2022-02-19 NOTE — ED Provider Notes (Signed)
[facility]  Note:  This document was prepared using Systems analyst and may include unintentional dictation errors.  MRN: [id number] DOB: 16-Nov-1960  Subjective:   [name] is a 62 y.o. female presenting for 1 week history of productive cough that elicits chest congestion, shob, sinus congestion, body aches. Has a history of asthma, uses an inhaler for this, has increased her use. Smokes 1/2ppd.  She is supposed to be on Symbicort but does not take this consistently.  Also reports shooting pains down her arm and feels numbness and tingling.  Has neck pain that elicits the symptoms.  Has longstanding history of back pains and was supposed to get x-rays but has not.  Reports that her pain is chronic in nature and has happened for months all over her back.  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  She does have a PCP she can follow-up with.  No history of diabetes.  No current facility-administered medications for this encounter.  Current Outpatient Medications:    acetaminophen (TYLENOL) 500 MG tablet, Take 1,000 mg by mouth every 6 (six) hours as needed., Disp: , Rfl:    albuterol (PROVENTIL) (2.5 MG/3ML) 0.083% nebulizer solution, Take 3 mLs (2.5 mg total) by nebulization every 4 (four) hours as needed for wheezing or shortness of breath., Disp: 3 mL, Rfl: 12   albuterol (VENTOLIN HFA) 108 (90 Base) MCG/ACT inhaler, Inhale 1-2 puffs into the lungs every 6 (six) hours as needed for wheezing or shortness of breath., Disp: 1 each, Rfl: 0   amoxicillin-clavulanate (AUGMENTIN) 875-125 MG tablet, Take 1 tablet by mouth every 12 (twelve) hours., Disp: 14 tablet, Rfl: 0   benzonatate (TESSALON) 100 MG capsule, Take 1 capsule (100 mg total) by mouth every 8 (eight) hours., Disp: 21 capsule, Rfl: 0   budesonide-formoterol (SYMBICORT) 80-4.5 MCG/ACT inhaler, Inhale 2 puffs into the lungs daily., Disp: 10.2 g, Rfl: 12   DULoxetine (CYMBALTA) 30 MG capsule, Take 1  capsule (30 mg total) by mouth daily., Disp: 30 capsule, Rfl: 2   fluticasone (FLONASE) 50 MCG/ACT nasal spray, Place 2 sprays into both nostrils daily., Disp: 1 g, Rfl: 0   ibuprofen (ADVIL) 600 MG tablet, Take 1 tablet (600 mg total) by mouth every 6 (six) hours as needed., Disp: 30 tablet, Rfl: 0   ipratropium (ATROVENT) 0.03 % nasal spray, Place 2 sprays into both nostrils every 12 (twelve) hours., Disp: 30 mL, Rfl: 12   nicotine (NICODERM CQ - DOSED IN MG/24 HOURS) 21 mg/24hr patch, Place 1 patch (21 mg total) onto the skin daily., Disp: 30 patch, Rfl: 11   No Known Allergies  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 (negative margins & nodes)   History of cellulitis    post op 2004 vulvectomy surgery --  left thigh celluitis-- resolved   History of cervical cancer    2002  carcinoma in situ  s/p  conization   History of peptic ulcer    age 62's   due to drinking alcohol --  resolved when quit  Pelvic cramping    PMB (postmenopausal bleeding)      Past Surgical History:  Procedure Laterality Date   DILATION AND CURETTAGE OF UTERUS N/A 09/25/2015   Procedure: DILATATION AND CURETTAGE;  Surgeon: [name], MD;  Location: [facility];  Service: Gynecology;  Laterality: N/A;   EUA/  COLD KNIFE CERVICAL CONIZATION/  ENDOCERVICAL CURETTAGE/  PARTIAL LEFT VULVECTOMY  04/10/2000   LEFT INGUINAL LYMPHADENECTOMY/  LEFT MODIFIED RADICAL VULVECTOMY/  RIGHT SIMPLE VULECTOMY/  EXCISION PERIANAL LESIONS  06/26/2002    Family History  Problem Relation Age of Onset   Hypertension Mother    Heart attack Father    Breast cancer Maternal Aunt     Cancer Maternal Aunt    Diabetes Maternal Grandmother     Social History   Tobacco Use   Smoking status: Every Day    Packs/day: 0.50    Years: 32.00    Total pack years: 16.00    Types: Cigarettes   Smokeless tobacco: Never   Tobacco comments:    pt is working on [employer] since 2016  Vaping Use   Vaping Use: Never used  Substance Use Topics   Alcohol use: Yes    Comment: occ   Drug use: No    ROS   Objective:   Vitals: BP 129/84 (BP Location: Right Arm)   Pulse 82   Temp 98.9 F (37.2 C) (Oral)   Resp 18   LMP 04/24/2015 (Approximate)   SpO2 95%   Physical Exam Constitutional:      General: She is not in acute distress.    Appearance: Normal appearance. She is well-developed and normal weight. She is not ill-appearing, toxic-appearing or diaphoretic.  HENT:     Head: Normocephalic and atraumatic.     Right Ear: Tympanic membrane, ear canal and external ear normal. No drainage or tenderness. No middle ear effusion. There is no impacted cerumen. Tympanic membrane is not erythematous or bulging.     Left Ear: Tympanic membrane, ear canal and external ear normal. No drainage or tenderness.  No middle ear effusion. There is no impacted cerumen. Tympanic membrane is not erythematous or bulging.     Nose: Congestion present. No rhinorrhea.     Mouth/Throat:     Mouth: Mucous membranes are moist. No oral lesions.     Pharynx: No pharyngeal swelling, oropharyngeal exudate, posterior oropharyngeal erythema or uvula swelling.     Tonsils: No tonsillar exudate or tonsillar abscesses.  Eyes:     General: No scleral icterus.       Right eye: No discharge.        Left eye: No discharge.     Extraocular Movements: Extraocular movements intact.     Right eye: Normal extraocular motion.     Left eye: Normal extraocular motion.     Conjunctiva/sclera: Conjunctivae normal.  Cardiovascular:     Rate and Rhythm: Normal rate and regular rhythm.     Heart sounds: Normal heart  sounds. No murmur heard.    No friction rub. No gallop.  Pulmonary:     Effort: Pulmonary effort is normal. No respiratory distress.     Breath sounds: No stridor. Rhonchi (mid to bibasilar fields) present. No wheezing or rales.  Chest:     Chest wall: No tenderness.  Musculoskeletal:     Cervical back: Neck supple. Spasms and tenderness present. No swelling, edema, deformity, erythema, signs of trauma, lacerations, rigidity, torticollis, bony tenderness or crepitus. Pain with movement present. Decreased  range of motion.     Comments: Positive Spurling maneuver to the left, negative Lhermitte sign.  Lymphadenopathy:     Cervical: No cervical adenopathy.  Skin:    General: Skin is warm and dry.  Neurological:     General: No focal deficit present.     Mental Status: She is alert and oriented to person, place, and time.  Psychiatric:        Mood and Affect: Mood normal.        Behavior: Behavior normal.     Assessment and Plan :   PDMP not reviewed this encounter.  1. Sinobronchitis   2. Cervical radiculopathy   3. Moderate persistent asthma, uncomplicated     Patient requested a COVID test for work.  Recommend double coverage for sinobronchitis with Augmentin and prednisone especially in the context of her asthma, smoking.  The prednisone will also help her with cervical radiculopathy. X-ray over-read was pending at time of discharge, recommended follow up with only abnormal results. Otherwise will not call for negative over-read. Patient was in agreement.  Emphasized need to follow-up with the neurosurgery and spine specialty clinic. Counseled patient on potential for adverse effects with medications prescribed/recommended today, ER and return-to-clinic precautions discussed, patient verbalized understanding.    Jaynee Eagles, PA-C 02/19/22 1140

## 2022-02-20 LAB — SARS CORONAVIRUS 2 (TAT 6-24 HRS): SARS Coronavirus 2: NEGATIVE

## 2022-03-10 ENCOUNTER — Ambulatory Visit: Payer: Commercial Managed Care - HMO | Attending: Physical Therapy | Admitting: Physical Therapy

## 2022-03-10 NOTE — Therapy (Deleted)
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 (negative margins & nodes)   History of cellulitis    post op 2004 vulvectomy surgery --  left thigh celluitis-- resolved   History of cervical cancer    2002  carcinoma in situ  s/p  conization   History of peptic ulcer    age 39's   due to drinking alcohol --  resolved when quit   Pelvic cramping    PMB (postmenopausal bleeding)    Past Surgical History:  Procedure Laterality Date   DILATION AND CURETTAGE OF UTERUS N/A 09/25/2015   Procedure: DILATATION AND CURETTAGE;  Surgeon: [name], MD;  Location: [facility];  Service: Gynecology;  Laterality: N/A;   EUA/  COLD KNIFE CERVICAL CONIZATION/  ENDOCERVICAL CURETTAGE/  PARTIAL LEFT VULVECTOMY  04/10/2000   LEFT INGUINAL LYMPHADENECTOMY/  LEFT MODIFIED RADICAL VULVECTOMY/  RIGHT SIMPLE VULECTOMY/  EXCISION PERIANAL LESIONS  06/26/2002   Patient Active Problem List   Diagnosis Date Noted   History of cervical cancer 04/22/2021   Tobacco abuse 04/22/2021   Spine pain, multilevel 04/22/2021   Leg pain, left 04/22/2021   Neuropathic pain, leg, right 04/22/2021   Hot flashes, menopausal 01/19/2016   Shoulder pain, left 01/19/2016   Postmenopausal bleeding 09/17/2015   Reactive airway disease 05/01/2015    PCP: Delene Ruffini MD   REFERRING PROVIDER: Kary Kos, MD  REFERRING DIAG: [phone] (ICD-10-CM) - Radiculopathy, cervical region  THERAPY DIAG:  No diagnosis  found.  Rationale for Evaluation and Treatment: Rehabilitation  ONSET DATE: ***  SUBJECTIVE:                                                                                                                                                                                                         SUBJECTIVE STATEMENT: ***  PERTINENT HISTORY:  ***  PAIN:  Are you having pain? {OPRCPAIN:27236}  PRECAUTIONS: {Therapy precautions:24002}  WEIGHT BEARING RESTRICTIONS: {Yes ***/No:24003}  FALLS:  Has patient fallen in last 6 months? {  fallsyesno:27318}  LIVING ENVIRONMENT: Lives with: {OPRC lives with:25569::"lives with their family"} Lives in: {Lives in:25570} Stairs: {opstairs:27293} Has following equipment at home: {Assistive devices:23999}  OCCUPATION: ***  PLOF: {PLOF:24004}  PATIENT GOALS: ***  NEXT MD VISIT:   OBJECTIVE:   DIAGNOSTIC FINDINGS:  ***  PATIENT SURVEYS:  {rehab surveys:24030}  COGNITION: Overall cognitive status: {cognition:24006}  SENSATION: {sensation:27233}  POSTURE: {posture:25561}  PALPATION: ***   CERVICAL ROM:   {AROM/PROM:27142} ROM A/PROM (deg) eval  Flexion   Extension   Right lateral flexion   Left lateral flexion   Right rotation   Left rotation    (Blank rows = not tested)  UPPER EXTREMITY ROM:  {AROM/PROM:27142} ROM Right eval Left eval  Shoulder flexion    Shoulder extension    Shoulder abduction    Shoulder adduction    Shoulder extension    Shoulder internal rotation    Shoulder external rotation    Elbow flexion    Elbow extension    Wrist flexion    Wrist extension    Wrist ulnar deviation    Wrist radial deviation    Wrist pronation    Wrist supination     (Blank rows = not tested)  UPPER EXTREMITY MMT:  MMT Right eval Left eval  Shoulder flexion    Shoulder extension    Shoulder abduction    Shoulder adduction    Shoulder extension    Shoulder internal rotation    Shoulder external  rotation    Middle trapezius    Lower trapezius    Elbow flexion    Elbow extension    Wrist flexion    Wrist extension    Wrist ulnar deviation    Wrist radial deviation    Wrist pronation    Wrist supination    Grip strength     (Blank rows = not tested)  CERVICAL SPECIAL TESTS:  {Cervical special tests:25246}  FUNCTIONAL TESTS:  {Functional tests:24029}  TODAY'S TREATMENT:                                                                                                                              DATE: ***   PATIENT EDUCATION:  Education details: *** Person educated: {Person educated:25204} Education method: {Education Method:25205} Education comprehension: {Education Comprehension:25206}  HOME EXERCISE PROGRAM: ***  ASSESSMENT:  CLINICAL IMPRESSION: Patient is a *** y.o. *** who was seen today for physical therapy evaluation and treatment for ***.   OBJECTIVE IMPAIRMENTS: {opptimpairments:25111}.   ACTIVITY LIMITATIONS: {activitylimitations:27494}  PARTICIPATION LIMITATIONS: {participationrestrictions:25113}  PERSONAL FACTORS: {Personal factors:25162} are also affecting patient's functional outcome.   REHAB POTENTIAL: {rehabpotential:25112}  CLINICAL DECISION MAKING: {clinical decision making:25114}  EVALUATION COMPLEXITY: {Evaluation complexity:25115}   GOALS: Goals reviewed with patient? {yes/no:20286}  SHORT TERM GOALS: Target date: ***  *** Baseline: *** Goal status: {GOALSTATUS:25110}  2.  *** Baseline: *** Goal status: {GOALSTATUS:25110}  3.  *** Baseline: *** Goal status: {GOALSTATUS:25110}  4.  *** Baseline: ***  Goal status: {GOALSTATUS:25110}  5.  *** Baseline: *** Goal status: {GOALSTATUS:25110}  6.  *** Baseline: *** Goal status: {GOALSTATUS:25110}  LONG TERM GOALS: Target date: ***  *** Baseline: *** Goal status: {GOALSTATUS:25110}  2.  *** Baseline: *** Goal status: {GOALSTATUS:25110}  3.  *** Baseline:  *** Goal status: {GOALSTATUS:25110}  4.  *** Baseline: *** Goal status: {GOALSTATUS:25110}  5.  *** Baseline: *** Goal status: {GOALSTATUS:25110}  6.  *** Baseline: *** Goal status: {GOALSTATUS:25110}   PLAN:  PT FREQUENCY: {rehab frequency:25116}  PT DURATION: {rehab duration:25117}  PLANNED INTERVENTIONS: {rehab planned interventions:25118::"Therapeutic exercises","Therapeutic activity","Neuromuscular re-education","Balance training","Gait training","Patient/Family education","Self Care","Joint mobilization"}  PLAN FOR NEXT SESSION: ***   Korine Winton, PT 03/10/2022, 7:54 AM

## 2022-05-04 ENCOUNTER — Ambulatory Visit (INDEPENDENT_AMBULATORY_CARE_PROVIDER_SITE_OTHER): Payer: Self-pay | Admitting: Student

## 2022-05-04 DIAGNOSIS — Z1231 Encounter for screening mammogram for malignant neoplasm of breast: Secondary | ICD-10-CM

## 2022-05-05 NOTE — Progress Notes (Signed)
Opened in error

## 2022-06-09 ENCOUNTER — Encounter: Payer: No Typology Code available for payment source | Admitting: Internal Medicine

## 2022-06-11 IMAGING — MG MM DIGITAL SCREENING BILAT W/ TOMO AND CAD
8 series · 9 of 24 positions shown · non-contrast
Comparison: None.

CLINICAL DATA: Screening.

EXAM:
DIGITAL SCREENING BILATERAL MAMMOGRAM WITH TOMOSYNTHESIS AND CAD
TECHNIQUE: Bilateral screening digital craniocaudal and mediolateral oblique
mammograms were obtained. Bilateral screening digital breast
tomosynthesis was performed. The images were evaluated with
computer-aided detection.

[R MLO synth-2D]
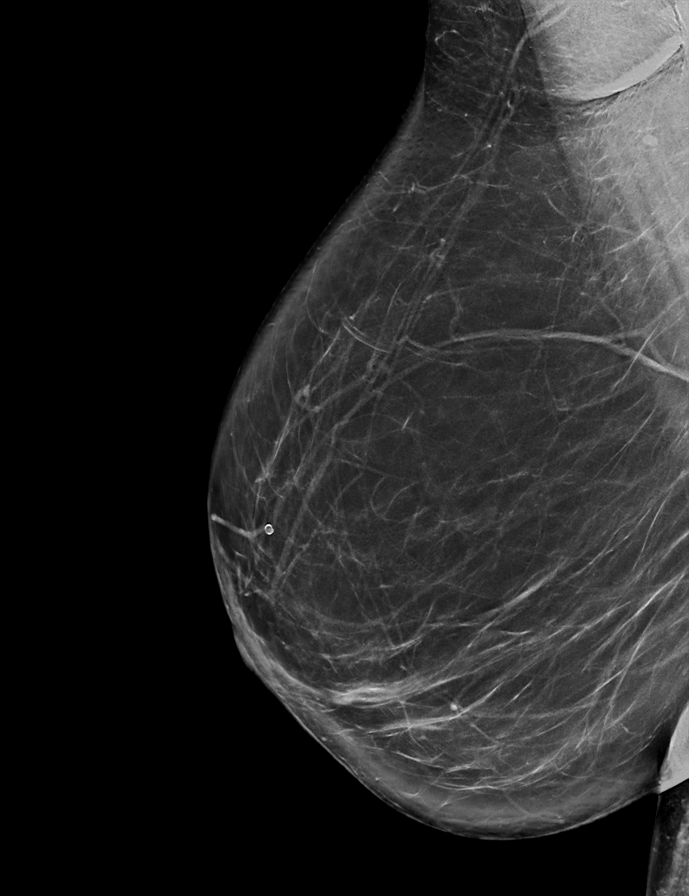

[L MLO synth-2D]
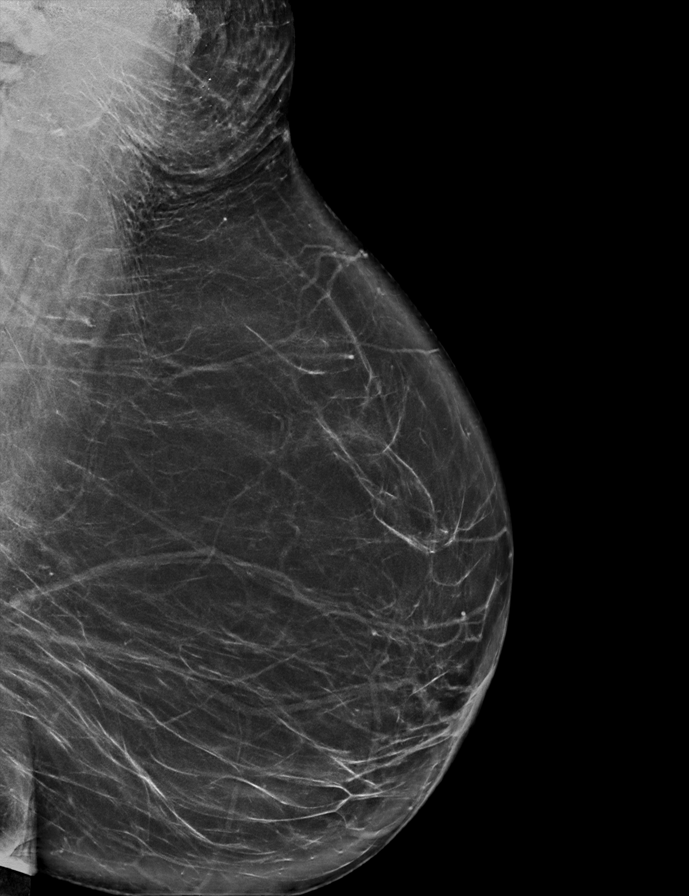

[R CC synth-2D]
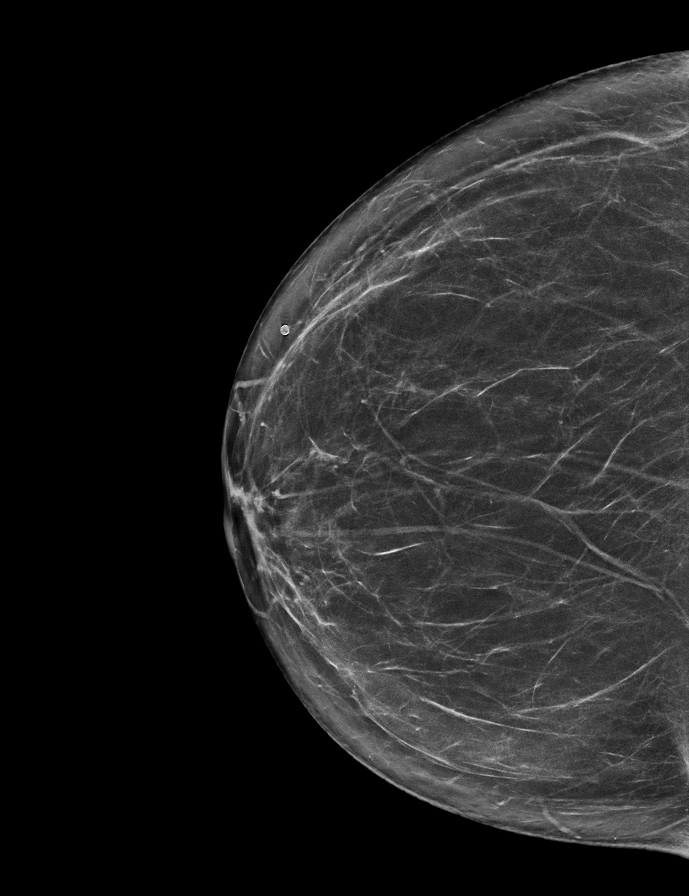

[L CC synth-2D]
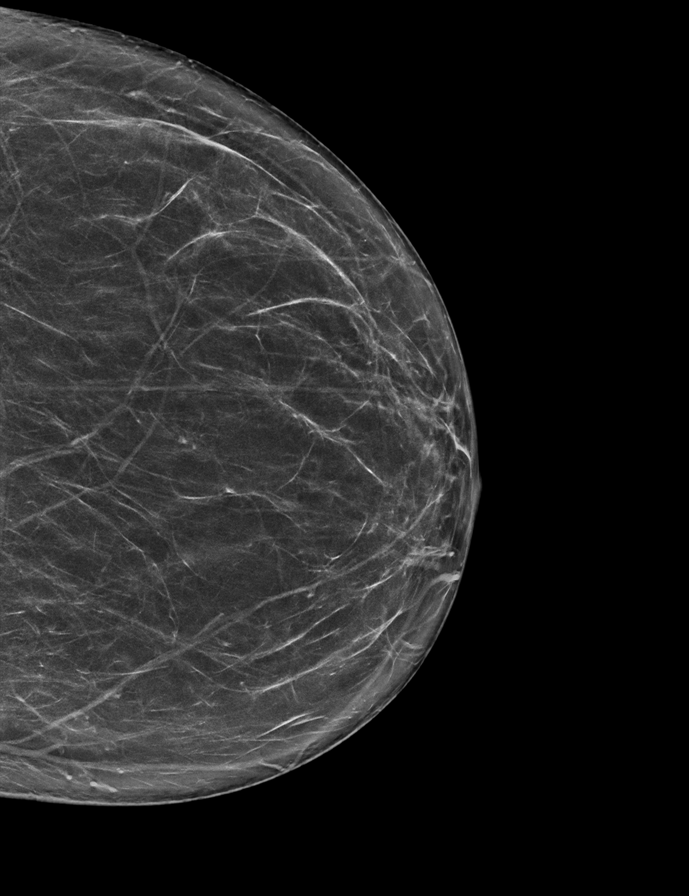

[R MLO tomo · 2 of 81 frames shown]
[frame 27/81]
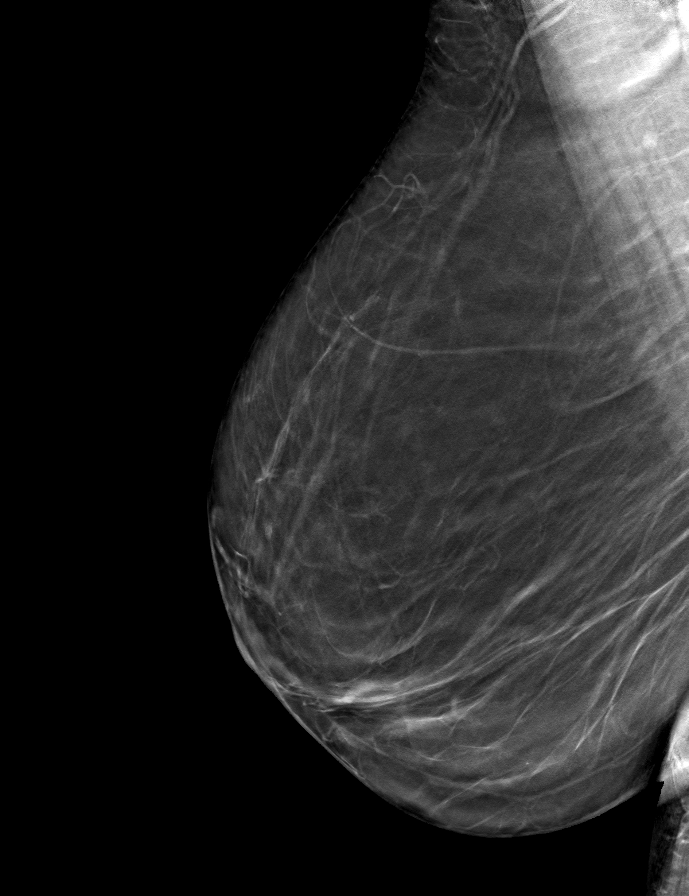
[frame 41/81]
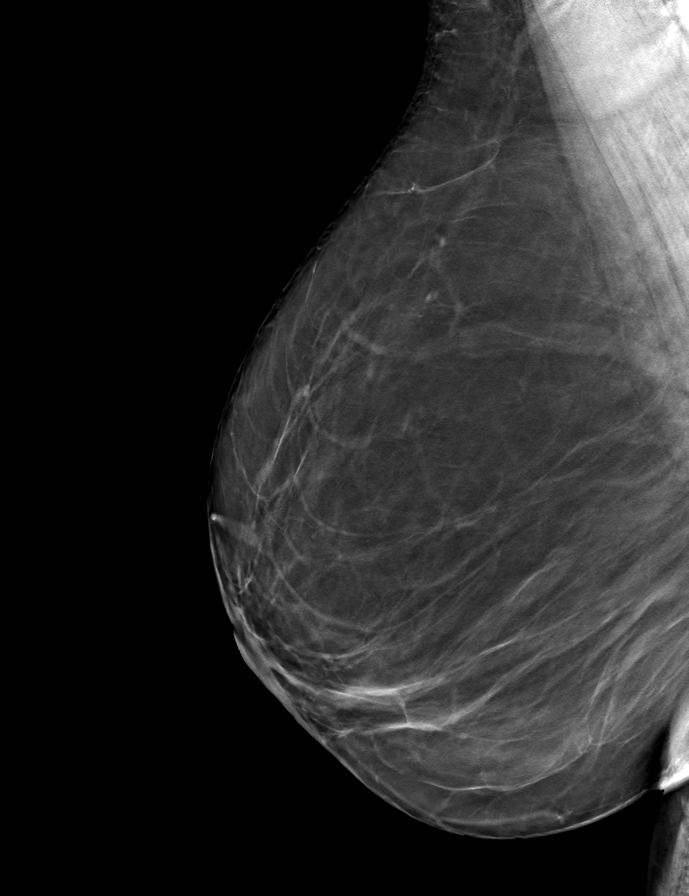

[R CC tomo · tomo slice 35/70.0]
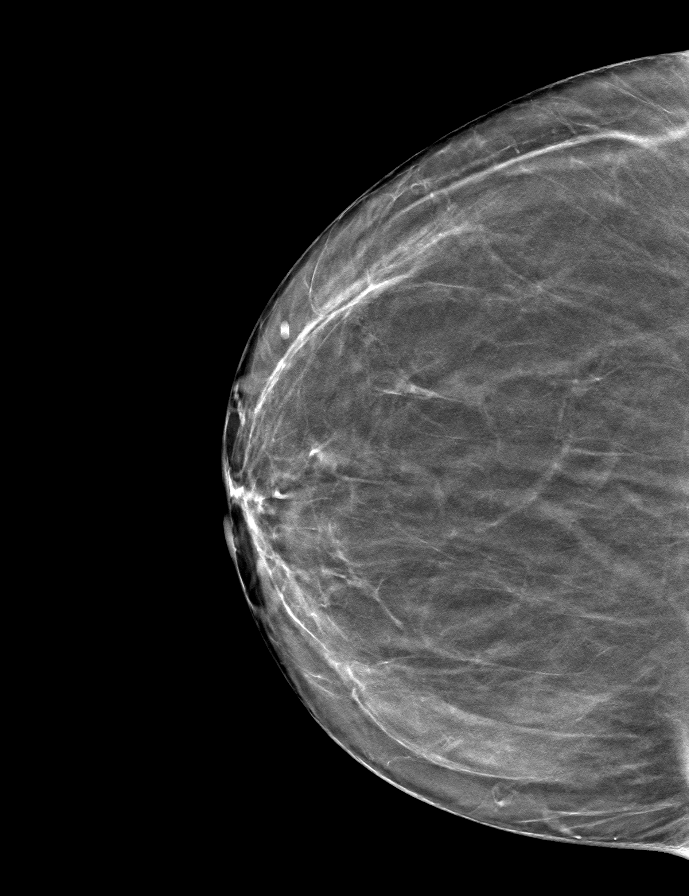

[L CC tomo · tomo slice 35/70.0]
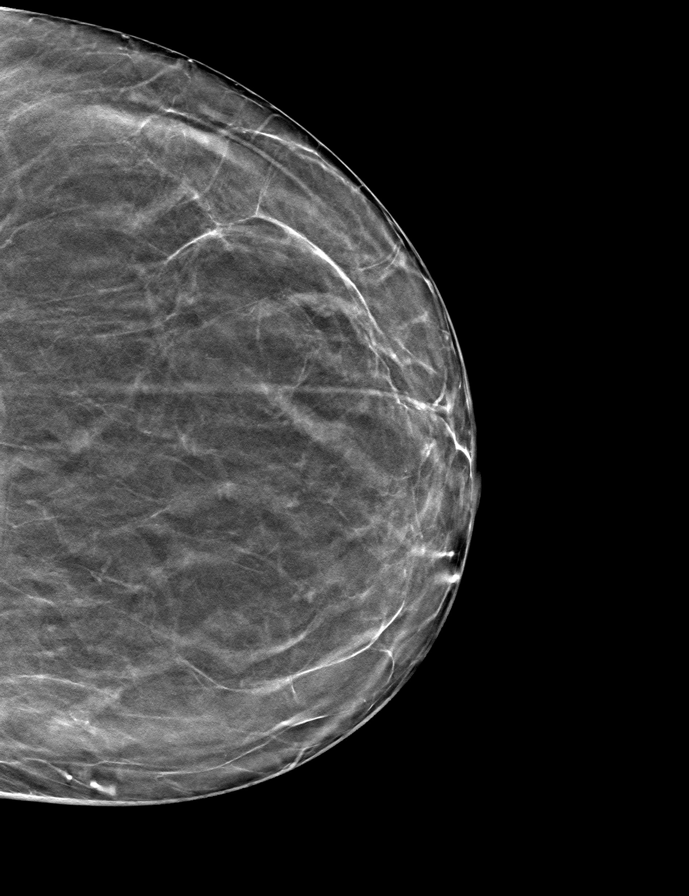

[L MLO tomo · tomo slice 41/81.0]
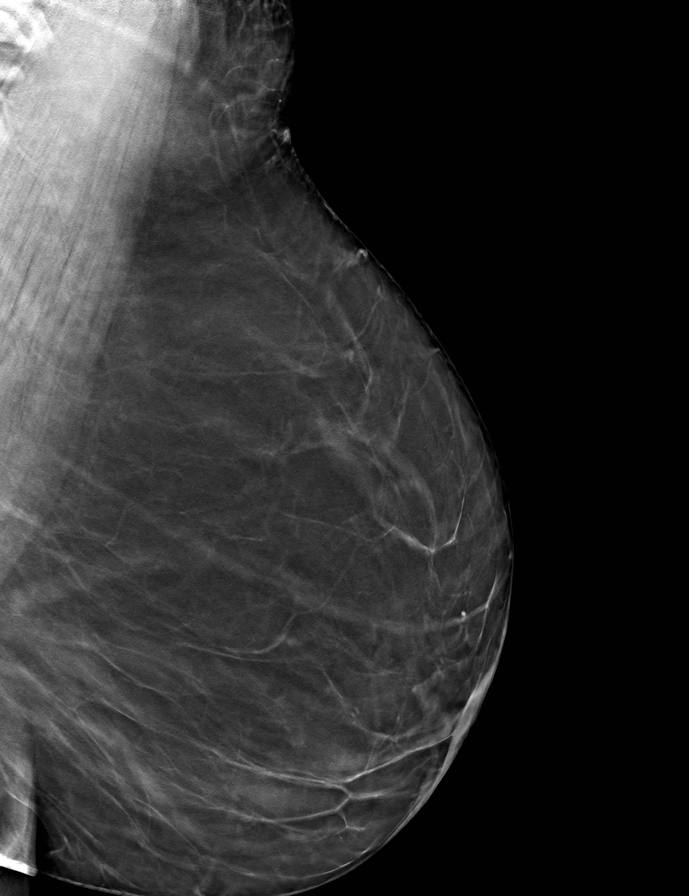

[9 of 24 positions shown; findings below may reference images not displayed]

ACR Breast Density Category b: There are scattered areas of
fibroglandular density.
FINDINGS: There are no findings suspicious for malignancy.
IMPRESSION: No mammographic evidence of malignancy. A result letter of this
screening mammogram will be mailed directly to the patient.

RECOMMENDATION:
Screening mammogram in one year. (Code:[id number]-[id number])

BI-RADS CATEGORY  1: Negative.

## 2022-06-14 ENCOUNTER — Encounter: Payer: Self-pay | Admitting: Internal Medicine

## 2022-06-14 ENCOUNTER — Ambulatory Visit (HOSPITAL_COMMUNITY)
Admission: RE | Admit: 2022-06-14 | Discharge: 2022-06-14 | Disposition: A | Discharge status: Home / Self Care | Payer: No Typology Code available for payment source | Source: Ambulatory Visit | Attending: Internal Medicine | Admitting: Internal Medicine

## 2022-06-14 ENCOUNTER — Ambulatory Visit (INDEPENDENT_AMBULATORY_CARE_PROVIDER_SITE_OTHER): Payer: Self-pay | Admitting: Internal Medicine

## 2022-06-14 ENCOUNTER — Other Ambulatory Visit: Payer: Self-pay

## 2022-06-14 VITALS — BP 117/78 | HR 85 | Temp 98.1°F | Ht 61.0 in | Wt 138.6 lb

## 2022-06-14 DIAGNOSIS — Z720 Tobacco use: Secondary | ICD-10-CM

## 2022-06-14 DIAGNOSIS — M792 Neuralgia and neuritis, unspecified: Secondary | ICD-10-CM

## 2022-06-14 DIAGNOSIS — R002 Palpitations: Secondary | ICD-10-CM

## 2022-06-14 DIAGNOSIS — Z0000 Encounter for general adult medical examination without abnormal findings: Secondary | ICD-10-CM

## 2022-06-14 DIAGNOSIS — J4540 Moderate persistent asthma, uncomplicated: Secondary | ICD-10-CM

## 2022-06-14 DIAGNOSIS — F17210 Nicotine dependence, cigarettes, uncomplicated: Secondary | ICD-10-CM

## 2022-06-14 NOTE — Progress Notes (Unsigned)
CC: check up  HPI:[name] is a 62 y.o. female who presents for evaluation of check up. Please see individual problem based A/P for details.  61 yof hx of cervical cancer, and had complained of leg pain, hot flashes, weight loss previously   Patient has endorsed weightloss and fevers. Given her smoking history and prior cervical cancer history, there is concern for possible underlying malignancy. We have been wanting to further investigate this, but have had to defer workup due to lack of insurance. She told me she has insurance starting May 1, so we will try to start this process. She also needs imaging of her back given neuropathic complaints.   Depression, PHQ-9: Based on the patients  score we have check up.  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 (negative margins & nodes)   History of cellulitis    post op 2004 vulvectomy surgery --  left thigh celluitis-- resolved   History of cervical cancer    2002  carcinoma in situ  s/p  conization   History of peptic ulcer    age 45's   due to drinking alcohol --  resolved when quit   Pelvic cramping    PMB (postmenopausal bleeding)    Review of Systems:   See hpi  Physical Exam: Vitals:   06/14/22 1429  BP: 117/78  Pulse: 85  Temp: 98.1 F (36.7 C)  TempSrc: Oral  SpO2: 99%  Weight: 138 lb 9.6 oz (62.9 kg)  Height: 5\' 1"  (1.549 m)   General: nad HEENT: Conjunctiva nl , antiicteric sclerae, moist mucous membranes, no exudate or erythema Cardiovascular: Normal rate, regular rhythm.  No murmurs, rubs, or gallops Pulmonary : Equal breath sounds, No wheezes, rales, or  rhonchi Abdominal: soft, nontender,  bowel sounds present Ext: No edema in lower extremities, no tenderness to palpation of lower extremities.   Assessment & Plan:   See Encounters Tab for problem based charting.  Reactive airway disease Increased SHOB recently. Endorses wheezing. Endorses some palpitations associated. Has need for albuterol and symbicort. Has run out her symbicort. Has used flonase to the point that she has caused nasal irritation. Coughing up clear mucus. Having sinus congestion along with itchy watery eyes. Has been using eye drops with minimal relief.  Of note, patient reports that she recently found large amount of black mold in her house.  Boggy turbinates. Lungs are clear.  History concerning for  environmental and seasonal allergies creating upper respiratory syndrome with post-nasal drip component.  She tells me that she has had wheezing which is possible given her history of reactive airway. No audible wheezing today. She reported associated palpitations so EKG was checked and reassuring. I did refill her albuterol and symbicort. She should limit albuterol use if having palpitations. Advised nasal irrigation with less frequent flonase use. Encouraged tobacco cessation. She will need PFTs in the future once insurance has started.   Tobacco abuse Still smoking. Encouraged cessation.  Neuropathic pain, leg, right Still having pain. Not able to get MRI previously. Says she has insurance starting May 1st. We will try  to follow this up.  Still having pain.  Neurovascularly intact at this time on exam. MRI as able.  Healthcare maintenance Patient has endorsed weightloss and fevers. Given her smoking history and prior cervical cancer history, there is concern for possible underlying malignancy. We have been wanting to further investigate this, but have had to defer workup due to lack of insurance. She told me she has insurance starting May 1, so we will try to start this  process. She also needs imaging of her back given neuropathic complaints.   Due for colon, shingles, HIV, and HCV. No insurance at this time.    Patient discussed with Dr. Mayford Knife

## 2022-06-14 NOTE — Patient Instructions (Addendum)
Dear [name],  Thank you for trusting [place] with your care today.  We discussed your weight loss and breathing.   I think that for the cough and breathing, there is a good chance that it is being exacerbated by the mold. In the meantime, please use a netti pot to irrigate your sinuses. Please also use OTC cetirizine. Avoid the Flonase for now to give your sinuses a break. We will refill your albuterol, Symbicort. I will work on getting a letter typed up for your landlord.  We really need to check some labs and other screening tests to further evaluate this weight loss.  [name].

## 2022-06-15 ENCOUNTER — Other Ambulatory Visit: Payer: Self-pay

## 2022-06-15 MED ORDER — BUDESONIDE-FORMOTEROL FUMARATE 80-4.5 MCG/ACT IN AERO
2.0000 | INHALATION_SPRAY | Freq: Every day | RESPIRATORY_TRACT | 12 refills | Status: DC
  Filled 2022-06-15: qty 10.2, 60d supply, fill #0
  Filled 2023-03-01 (×2): qty 10.2, 60d supply, fill #1

## 2022-06-15 MED ORDER — ALBUTEROL SULFATE HFA 108 (90 BASE) MCG/ACT IN AERS
1.0000 | INHALATION_SPRAY | Freq: Four times a day (QID) | RESPIRATORY_TRACT | 0 refills | Status: DC | PRN
  Filled 2022-06-15: qty 6.7, 25d supply, fill #0

## 2022-06-15 MED ORDER — ALBUTEROL SULFATE HFA 108 (90 BASE) MCG/ACT IN AERS: 1.0000 | INHALATION_SPRAY | Freq: Four times a day (QID) | RESPIRATORY_TRACT | 0 refills | Status: DC | PRN

## 2022-06-15 MED ORDER — BUDESONIDE-FORMOTEROL FUMARATE 80-4.5 MCG/ACT IN AERO: 2.0000 | INHALATION_SPRAY | Freq: Every day | RESPIRATORY_TRACT | 12 refills | Status: DC

## 2022-06-15 NOTE — Progress Notes (Signed)
EKG that was checked for patient's subjective palpitations was normal. Reassuring physical exam and otherwise asymptomatic history. If this continues to be a complaint, can possibly consider cardiac monitor.

## 2022-06-16 ENCOUNTER — Other Ambulatory Visit (HOSPITAL_COMMUNITY): Payer: Self-pay

## 2022-06-16 ENCOUNTER — Encounter: Payer: Self-pay | Admitting: Internal Medicine

## 2022-06-16 NOTE — Assessment & Plan Note (Addendum)
Still having pain. Not able to get MRI previously. Says she has insurance starting May 1st. We will try to follow this up.  Still having pain.  Neurovascularly intact at this time on exam. MRI as able.

## 2022-06-16 NOTE — Assessment & Plan Note (Signed)
Still smoking  Encouraged cessation  

## 2022-06-16 NOTE — Assessment & Plan Note (Addendum)
Patient has endorsed weightloss and fevers. Given her smoking history and prior cervical cancer history, there is concern for possible underlying malignancy. We have been wanting to further investigate this, but have had to defer workup due to lack of insurance. She told me she has insurance starting May 1, so we will try to start this process. She also needs imaging of her back given neuropathic complaints.   Due for colon, shingles, HIV, and HCV. No insurance at this time.

## 2022-06-16 NOTE — Assessment & Plan Note (Addendum)
Increased SHOB recently. Endorses wheezing. Endorses some palpitations associated. Has need for albuterol and symbicort. Has run out her symbicort. Has used flonase to the point that she has caused nasal irritation. Coughing up clear mucus. Having sinus congestion along with itchy watery eyes. Has been using eye drops with minimal relief.  Of note, patient reports that she recently found large amount of black mold in her house.  Boggy turbinates. Lungs are clear.  History concerning for  environmental and seasonal allergies creating upper respiratory syndrome with post-nasal drip component.  She tells me that she has had wheezing which is possible given her history of reactive airway. No audible wheezing today. She reported associated palpitations so EKG was checked and reassuring. I did refill her albuterol and symbicort. She should limit albuterol use if having palpitations. Advised nasal irrigation with less frequent flonase use. Encouraged tobacco cessation. She will need PFTs in the future once insurance has started.

## 2022-06-18 ENCOUNTER — Other Ambulatory Visit (HOSPITAL_COMMUNITY): Payer: Self-pay

## 2022-06-23 NOTE — Progress Notes (Signed)
Internal Medicine Clinic Attending  Case discussed with Dr. Burnice Logan  At the time of the visit.  We reviewed the resident's history and exam and pertinent patient test results.  I agree with the assessment, diagnosis, and plan of care documented in the resident's note. Agree with proceeding with pending diagnostics as soon as insurance is activated.

## 2022-06-29 ENCOUNTER — Encounter: Payer: Self-pay | Admitting: Internal Medicine

## 2022-07-14 ENCOUNTER — Ambulatory Visit (INDEPENDENT_AMBULATORY_CARE_PROVIDER_SITE_OTHER): Payer: Self-pay | Admitting: Student

## 2022-07-14 ENCOUNTER — Encounter: Payer: Self-pay | Admitting: Student

## 2022-07-14 ENCOUNTER — Other Ambulatory Visit (HOSPITAL_COMMUNITY): Payer: Self-pay

## 2022-07-14 VITALS — BP 128/89 | HR 92 | Temp 98.5°F | Wt 138.6 lb

## 2022-07-14 DIAGNOSIS — Z77120 Contact with and (suspected) exposure to mold (toxic): Secondary | ICD-10-CM

## 2022-07-14 DIAGNOSIS — J4540 Moderate persistent asthma, uncomplicated: Secondary | ICD-10-CM

## 2022-07-14 DIAGNOSIS — F17210 Nicotine dependence, cigarettes, uncomplicated: Secondary | ICD-10-CM

## 2022-07-14 DIAGNOSIS — Z5919 Other inadequate housing: Secondary | ICD-10-CM

## 2022-07-14 DIAGNOSIS — Z0000 Encounter for general adult medical examination without abnormal findings: Secondary | ICD-10-CM

## 2022-07-14 DIAGNOSIS — Z5989 Other problems related to housing and economic circumstances: Secondary | ICD-10-CM

## 2022-07-14 DIAGNOSIS — Z8541 Personal history of malignant neoplasm of cervix uteri: Secondary | ICD-10-CM

## 2022-07-14 MED ORDER — AZITHROMYCIN 500 MG PO TABS
500.0000 mg | ORAL_TABLET | Freq: Every day | ORAL | 0 refills | Status: AC
  Filled 2022-07-14: qty 3, 3d supply, fill #0

## 2022-07-14 MED ORDER — PREDNISONE 20 MG PO TABS
20.0000 mg | ORAL_TABLET | Freq: Every day | ORAL | 0 refills | Status: DC
  Filled 2022-07-14: qty 5, 5d supply, fill #0

## 2022-07-14 NOTE — Progress Notes (Unsigned)
   CC: persistent SOB, cough, nasal congestion  HPI:  [name] is a 62 y.o. F with PMH per below. She presents for persistent dyspnea and worsening cough, and nasal congestion after being seen in clinic 1 month ago.  Patient states that her symptoms began about 6 months ago. She initially attributed her SOB and cough to mold exposure. She alerted her landlord about the black mold, but this unfortunately has not been removed. Over the past month she states her dyspnea has worsened. She has both rest dyspnea and dyspnea with activity. She has also developed nasal congestion over the last month. Regarding her cough, she states this was initially productive of clear sputum but over the last week she has had purulent appearing sputum.  She denies objective fevers and chills but notes she has felt feverish. She has continued to use her symbicort and uses her albuterol 3-4x daily to minimal effect.   Of note, patient states she continues to smoke about a pack per day, but recently cut back due to her shortness of breath.  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   Physical Exam:  Vitals:   07/14/22 1044  BP: 128/89  Pulse: 92  Temp: 98.5 F (36.9 C)  TempSrc: Oral  SpO2:  100%  Weight: 138 lb 9.6 oz (62.9 kg)   Constitutional: Well-developed, well-nourished, and in no distress.   Cardiovascular: Normal rate, regular rhythm, intact distal pulses. No gallop and no friction rub.  No murmur heard. No lower extremity edema  Pulmonary: Non labored breathing on room air, no wheezing or rales  Abdominal: Soft. Normal bowel sounds. Non distended and non tender Musculoskeletal: Normal range of motion.        General: No tenderness or edema.  Neurological: Alert and oriented to person, place, and time. Non focal  Skin: Skin is warm and dry.    Assessment & Plan:   See Encounters Tab for problem based charting.  Patient discussed with Dr. Heide Spark

## 2022-07-14 NOTE — Patient Instructions (Addendum)
1-800-QUIT-NOW [phone]), for information regarding smoking cessation.   Continue your symbicort everyday.   Continue with nasal sprays saline. Please also use neil sinus rinse.   Please use afrin at night for about three days only.   We will also send you a prescription for prednisone and azithromycin.   Please work on getting orange card.

## 2022-07-15 NOTE — Assessment & Plan Note (Signed)
Pap smear 05/2021, negative for malignancy or precancerous lesions. She will need repeat pap smear in 5 y per gyn.

## 2022-07-15 NOTE — Assessment & Plan Note (Signed)
Continuing to delay until patient is insured. She is UTD with cancer screening through [facility].

## 2022-07-15 NOTE — Assessment & Plan Note (Addendum)
Patient has become more symptomatic, noting worsening dyspnea, cough with purulent appearing sputum, and persistent nasal congestion. She states she continues to have mold exposure and also smokes about a pack per day of cigarettes.   Patient has never had PFTs.   Unclear exact etiology of patient's worsening symptoms. It is possible that she has underlying COPD given her smoking history. Discussed with patient tobacco cessation and prescribed her a course of azithromycin and prednisone. Also encouraged her to continue her symbicort and as needed albuterol. Also considered URI, however patient has been symptomatic for multiple months. It is possible she has a URI superimposed on her baseline COPD. Finally, considered bacterial pneumonia given patient's productive cough, but she has no rales or wheezing on exam and notes only feeling feverish briefly. Her O2 saturations are also 100% on RA.   Plan: Will prescribe patient course of prednisone and azithromycin. Will consider obtaining chest x ray at next visit if symptoms have not improved with previously mentioned treatment.   Sent referral to [facility] professional who is also assisting [place] with SW issues now, for assistance with helping patient navigate getting landlord to remove mold. In addition, patient needs assistance with obtaining orange card and/or medicaid.  Emphasized importance of smoking cessation.  Gave patient number to [facility] hotline for assistance with this.

## 2022-07-19 NOTE — Progress Notes (Signed)
Internal Medicine Clinic Attending  Case discussed with Dr. Carter  At the time of the visit.  We reviewed the resident's history and exam and pertinent patient test results.  I agree with the assessment, diagnosis, and plan of care documented in the resident's note.  

## 2022-07-20 ENCOUNTER — Telehealth: Payer: Self-pay

## 2022-07-20 NOTE — Telephone Encounter (Signed)
Requesting medication for cough. Please call back.

## 2022-07-20 NOTE — Telephone Encounter (Signed)
Returned pt's call - no answer. Unable to leave a message - "call cannot be completed".

## 2022-08-24 ENCOUNTER — Encounter: Payer: No Typology Code available for payment source | Admitting: Internal Medicine

## 2022-08-24 NOTE — Progress Notes (Deleted)
[place] for cough medication 07/20/22

## 2022-11-16 DIAGNOSIS — Z419 Encounter for procedure for purposes other than remedying health state, unspecified: Secondary | ICD-10-CM | POA: Diagnosis not present

## 2022-12-15 ENCOUNTER — Ambulatory Visit (HOSPITAL_COMMUNITY)
Admission: EM | Admit: 2022-12-15 | Discharge: 2022-12-15 | Disposition: A | Discharge status: Home / Self Care | Payer: Medicaid Other | Attending: Family Medicine | Admitting: Family Medicine

## 2022-12-15 ENCOUNTER — Encounter (HOSPITAL_COMMUNITY): Payer: Self-pay | Admitting: *Deleted

## 2022-12-15 ENCOUNTER — Other Ambulatory Visit: Payer: Self-pay

## 2022-12-15 ENCOUNTER — Ambulatory Visit (HOSPITAL_COMMUNITY): Payer: Medicaid Other

## 2022-12-15 DIAGNOSIS — M25512 Pain in left shoulder: Secondary | ICD-10-CM | POA: Diagnosis not present

## 2022-12-15 MED ORDER — NAPROXEN 375 MG PO TABS: 375.0000 mg | ORAL_TABLET | Freq: Two times a day (BID) | ORAL | 0 refills | Status: DC

## 2022-12-15 MED ORDER — KETOROLAC TROMETHAMINE 30 MG/ML IJ SOLN
INTRAMUSCULAR | Status: AC
  Filled 2022-12-15: qty 1

## 2022-12-15 MED ORDER — KETOROLAC TROMETHAMINE 30 MG/ML IJ SOLN
30.0000 mg | Freq: Once | INTRAMUSCULAR | Status: AC
  Administered 2022-12-15: 30 mg via INTRAMUSCULAR

## 2022-12-15 MED ORDER — HYDROCODONE-ACETAMINOPHEN 5-325 MG PO TABS: 1.0000 | ORAL_TABLET | Freq: Four times a day (QID) | ORAL | 0 refills | Status: AC | PRN

## 2022-12-15 NOTE — Discharge Instructions (Signed)
Be aware, you have been prescribed pain medications that may cause drowsiness. While taking this medication, do not take any other medications containing acetaminophen (Tylenol). Do not combine with alcohol or recreational drugs. Please do not drive, operate heavy machinery, or take part in activities that require making important decisions while on this medication as your judgement may be clouded.  

## 2022-12-15 NOTE — ED Triage Notes (Signed)
Pt reports she has been moving and has developed Lt shoulder pain for one week. Pt reports she has a knot that has pop up on Lt shoulder. The pain goes up into the neck.

## 2022-12-17 DIAGNOSIS — Z419 Encounter for procedure for purposes other than remedying health state, unspecified: Secondary | ICD-10-CM | POA: Diagnosis not present

## 2022-12-18 NOTE — ED Provider Notes (Signed)
[facility] CARE CENTER   [id number] 12/15/22 Arrival Time: 1722  ASSESSMENT & PLAN:  1. Acute pain of left shoulder    I have personally viewed and independently interpreted the imaging studies ordered this visit. L Shoulder: no acute changes appreciated.   Discharge Medication List as of 12/15/2022  7:07 PM     START taking these medications   Details  HYDROcodone-acetaminophen (NORCO/VICODIN) 5-325 MG tablet Take 1 tablet by mouth every 6 (six) hours as needed for moderate pain (pain score 4-6) or severe pain (pain score 7-10)., Starting Wed 12/15/2022, Normal    naproxen (NAPROSYN) 375 MG tablet Take 1 tablet (375 mg total) by mouth 2 (two) times daily with a meal., Starting Wed 12/15/2022, Normal        Orders Placed This Encounter  Procedures   DG Shoulder Left   Work/school excuse note: not needed. Recommend:  Follow-up Information     Schedule an appointment as soon as possible for a visit  with Tarry Kos, MD.   Specialty: Orthopedic Surgery Contact information: [address] [place] [address] [phone]                [place] Controlled Substances Registry consulted for this patient. I feel the risk/benefit ratio today is favorable for proceeding with this prescription for a controlled substance. Medication sedation precautions given.  Reviewed expectations re: course of current medical issues. Questions answered. Outlined signs and symptoms indicating need for more acute intervention. Patient verbalized understanding. After Visit Summary given.  SUBJECTIVE: History from: patient with family member interpreting. [name] is a 62 y.o. female who reports she has been moving furniture and has developed Lt shoulder pain for one week. Pt reports she has a knot that has pop up on Lt shoulder. The pain goes up into the neck. No extremity sensation changes or weakness. No tx PTA.  Past Surgical History:  Procedure Laterality Date    DILATION AND CURETTAGE OF UTERUS N/A 09/25/2015   Procedure: DILATATION AND CURETTAGE;  Surgeon: Laurette Schimke, MD;  Location: [facility] [place];  Service: Gynecology;  Laterality: N/A;   EUA/  COLD KNIFE CERVICAL CONIZATION/  ENDOCERVICAL CURETTAGE/  PARTIAL LEFT VULVECTOMY  04/10/2000   LEFT INGUINAL LYMPHADENECTOMY/  LEFT MODIFIED RADICAL VULVECTOMY/  RIGHT SIMPLE VULECTOMY/  EXCISION PERIANAL LESIONS  06/26/2002      OBJECTIVE:  Vitals:   12/15/22 1803  BP: 117/82  Pulse: 76  Resp: 18  Temp: 99.1 F (37.3 C)  SpO2: 96%    General appearance: alert; no distress HEENT: [place]; AT Neck: supple with FROM Resp: unlabored respirations Extremities: LUE: warm with well perfused appearance; fairly well localized moderate tenderness over left anterior shoulder; without gross deformities; swelling: none; bruising: none; shoulder ROM: limited by reported pain CV: brisk extremity capillary refill of bilateral UE; 2+ radial pulse of bilateral UE. Skin: warm and dry; no visible rashes Neurologic: gait normal; normal sensation and strength of all extremities Psychological: alert and cooperative; normal mood and affect  Imaging: DG Shoulder Left  Result Date: 12/15/2022 CLINICAL DATA:  Pain EXAM: LEFT SHOULDER - 2+ VIEW COMPARISON:  None Available. FINDINGS: There is no evidence of fracture or dislocation. There is no evidence of arthropathy or other focal bone abnormality. Soft tissues are unremarkable. IMPRESSION: Negative. Electronically Signed   By: Darliss Cheney M.D.   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 (negative margins & nodes)   History of cellulitis    post op 2004 vulvectomy surgery --  left thigh celluitis-- resolved   History of cervical cancer    2002  carcinoma in situ  s/p  conization   History of peptic ulcer    age 65's   due to drinking alcohol --  resolved when quit   Pelvic cramping    PMB (postmenopausal bleeding)    Social History   Socioeconomic History   Marital status: Single    Spouse name: Not on file   Number of children: 0   Years of education: Not on file   Highest education level: High school graduate  Occupational History   Not on file  Tobacco Use   Smoking status: Every Day    Current packs/day: 0.50    Average packs/day: 0.5 packs/day for 32.0 years (16.0 ttl pk-yrs)    Types: Cigarettes   Smokeless tobacco: Never   Tobacco comments:    pt is working on [employer] since 2016  Vaping Use   Vaping status: Never Used  Substance and Sexual Activity   Alcohol use: Yes    Comment: occ   Drug use: No   Sexual activity: Yes    Birth control/protection: Post-menopausal  Other Topics Concern   Not on file  Social History Narrative   Works as a Copywriter, advertising   Social Determinants of Corporate investment banker Strain: Not on file  Food Insecurity: No Food Insecurity (06/04/2021)   Hunger Vital Sign    Worried About Running Out of Food in the Last Year: Never true    Ran Out of Food in the Last Year: Never true  Transportation Needs: No Transportation Needs (06/04/2021)   PRAPARE - Administrator, Civil Service (Medical): No    Lack of Transportation (Non-Medical): No  Physical Activity: Not on file  Stress: Not on file  Social Connections: Not on file   Family History  Problem Relation Age of Onset   Hypertension Mother    Heart attack Father    Breast cancer Maternal Aunt    Cancer Maternal Aunt    Diabetes Maternal Grandmother    Past Surgical History:  Procedure Laterality Date   DILATION AND CURETTAGE  OF UTERUS N/A 09/25/2015   Procedure: DILATATION AND CURETTAGE;  Surgeon: Laurette Schimke, MD;  Location: [facility];  Service: Gynecology;  Laterality: N/A;   EUA/  COLD KNIFE CERVICAL CONIZATION/  ENDOCERVICAL CURETTAGE/  PARTIAL LEFT VULVECTOMY  04/10/2000   LEFT INGUINAL LYMPHADENECTOMY/  LEFT MODIFIED RADICAL VULVECTOMY/  RIGHT SIMPLE VULECTOMY/  EXCISION PERIANAL LESIONS  06/26/2002       Mardella Layman, MD 12/18/22 1009

## 2022-12-23 ENCOUNTER — Ambulatory Visit (INDEPENDENT_AMBULATORY_CARE_PROVIDER_SITE_OTHER): Payer: Medicaid Other | Admitting: Orthopaedic Surgery

## 2022-12-23 DIAGNOSIS — M25512 Pain in left shoulder: Secondary | ICD-10-CM

## 2022-12-23 DIAGNOSIS — G8929 Other chronic pain: Secondary | ICD-10-CM | POA: Diagnosis not present

## 2022-12-23 MED ORDER — METHYLPREDNISOLONE ACETATE 40 MG/ML IJ SUSP
40.0000 mg | INTRAMUSCULAR | Status: AC | PRN
  Administered 2022-12-23: 40 mg via INTRA_ARTICULAR

## 2022-12-23 MED ORDER — LIDOCAINE HCL 1 % IJ SOLN
3.0000 mL | INTRAMUSCULAR | Status: AC | PRN
  Administered 2022-12-23: 3 mL

## 2022-12-23 MED ORDER — BUPIVACAINE HCL 0.5 % IJ SOLN
3.0000 mL | INTRAMUSCULAR | Status: AC | PRN
  Administered 2022-12-23: 3 mL via INTRA_ARTICULAR

## 2022-12-23 NOTE — Progress Notes (Signed)
Office Visit Note   Patient: [name]           Date of Birth: Jul 09, 1960           MRN: [id number] Visit Date: 12/23/2022              Requested by: Masters, [place], DO [address] [place],  [place] [address] PCP: Rudene Christians, DO   Assessment & Plan: Visit Diagnoses:  1. Chronic left shoulder pain     Plan: [name] is a 62 year old female with left shoulder pain.  Impression is an overuse condition.  She feels that naproxen helps a little bit.  We will try subacromial injection and a period of rest and activity modification.  Follow-up if symptoms do not improve after 6 weeks.  Follow-Up Instructions: No follow-ups on file.   Orders:  Orders Placed This Encounter  Procedures   Large Joint Inj: R subacromial bursa   No orders of the defined types were placed in this encounter.     Procedures: Large Joint Inj: R subacromial bursa on 12/23/2022 11:05 AM Indications: pain Details: 22 G needle  Arthrogram: No  Medications: 3 mL lidocaine 1 %; 3 mL bupivacaine 0.5 %; 40 mg methylPREDNISolone acetate 40 MG/ML Outcome: tolerated well, no immediate complications Consent was given by the patient. Patient was prepped and draped in the usual sterile fashion.       Clinical Data: No additional findings.   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  She states that she recently moved and had to do a lot of lifting.  Feels pain throughout the shoulder with movement.  Denies any radicular symptoms. Review of Systems  Constitutional: Negative.   HENT: Negative.    Eyes: Negative.   Respiratory: Negative.    Cardiovascular: Negative.   Endocrine: Negative.   Musculoskeletal: Negative.   Neurological: Negative.   Hematological: Negative.   Psychiatric/Behavioral: Negative.    All other systems reviewed and are  negative.    Objective: Vital Signs: LMP 04/24/2015 (Approximate)   Physical Exam Vitals and nursing note reviewed.  Constitutional:      Appearance: She is well-developed.  HENT:     Head: Atraumatic.     Nose: Nose normal.  Eyes:     Extraocular Movements: Extraocular movements intact.  Cardiovascular:     Pulses: Normal pulses.  Pulmonary:     Effort: Pulmonary effort is normal.  Abdominal:     Palpations: Abdomen is soft.  Musculoskeletal:     Cervical back: Neck supple.  Skin:    General: Skin is warm.     Capillary Refill: Capillary refill takes less than 2 seconds.  Neurological:     Mental Status: She is alert. Mental status is at baseline.  Psychiatric:        Behavior: Behavior normal.        Thought Content: Thought content normal.        Judgment: Judgment normal.     Ortho Exam Exam of the left shoulder shows normal passive range of motion with pain.  Manual muscle testing shows no focal motor deficits although the patient does not fully participate due to pain and guarding.  Moderate pain with impingement testing. Specialty Comments:  X-rays of the left shoulder in PACS independently reviewed and interpreted shows mild arthritic changes  to the glenohumeral and AC joint.  No acute or structural abnormalities.  Imaging: No results found.   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 (per dr Nelly Rout note)   Asthma    seasonal and environmental   History of cancer of vulva  oncologist-  dr [name]-pearson/  dr Nelly Rout   2002  -- carcinoma in situ  s/p  vulvectomy/   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 (negative margins & nodes)   History of cellulitis    post op 2004 vulvectomy surgery --  left thigh celluitis-- resolved   History of cervical cancer    2002  carcinoma in situ  s/p  conization   History of peptic ulcer    age 34's   due to drinking alcohol --  resolved when quit   Pelvic cramping    PMB (postmenopausal bleeding)     Family History  Problem Relation Age of Onset   Hypertension Mother    Heart attack Father    Breast cancer Maternal Aunt    Cancer Maternal Aunt    Diabetes Maternal Grandmother     Past Surgical History:  Procedure Laterality Date   DILATION AND CURETTAGE OF UTERUS N/A 09/25/2015   Procedure: DILATATION AND CURETTAGE;  Surgeon: Laurette Schimke, MD;  Location: [facility] [place];  Service: Gynecology;  Laterality: N/A;   EUA/  COLD KNIFE CERVICAL CONIZATION/  ENDOCERVICAL CURETTAGE/  PARTIAL LEFT VULVECTOMY  04/10/2000   LEFT INGUINAL LYMPHADENECTOMY/  LEFT MODIFIED RADICAL VULVECTOMY/  RIGHT SIMPLE VULECTOMY/  EXCISION PERIANAL LESIONS  06/26/2002   Social History   Occupational History   Not on file  Tobacco Use   Smoking status: Every Day    Current packs/day: 0.50    Average packs/day: 0.5 packs/day for 32.0 years (16.0 ttl pk-yrs)    Types: Cigarettes   Smokeless tobacco: Never   Tobacco comments:    pt is working on [employer] since 2016  Vaping Use   Vaping status: Never Used  Substance and Sexual Activity   Alcohol use: Yes    Comment: occ   Drug use: No   Sexual activity: Yes    Birth control/protection: Post-menopausal

## 2023-01-16 DIAGNOSIS — Z419 Encounter for procedure for purposes other than remedying health state, unspecified: Secondary | ICD-10-CM | POA: Diagnosis not present

## 2023-02-16 DIAGNOSIS — Z419 Encounter for procedure for purposes other than remedying health state, unspecified: Secondary | ICD-10-CM | POA: Diagnosis not present

## 2023-02-28 ENCOUNTER — Encounter: Payer: Self-pay | Admitting: Student

## 2023-02-28 ENCOUNTER — Ambulatory Visit (INDEPENDENT_AMBULATORY_CARE_PROVIDER_SITE_OTHER): Payer: Medicaid Other | Admitting: Student

## 2023-02-28 ENCOUNTER — Other Ambulatory Visit (HOSPITAL_COMMUNITY): Payer: Self-pay

## 2023-02-28 DIAGNOSIS — J45901 Unspecified asthma with (acute) exacerbation: Secondary | ICD-10-CM | POA: Insufficient documentation

## 2023-02-28 DIAGNOSIS — J45909 Unspecified asthma, uncomplicated: Secondary | ICD-10-CM

## 2023-02-28 MED ORDER — GUAIFENESIN 100 MG/5ML PO LIQD
5.0000 mL | ORAL | 0 refills | Status: DC | PRN
  Filled 2023-02-28: qty 236, 8d supply, fill #0

## 2023-02-28 MED ORDER — PREDNISONE 20 MG PO TABS
20.0000 mg | ORAL_TABLET | Freq: Every day | ORAL | 0 refills | Status: DC
  Filled 2023-02-28: qty 5, 5d supply, fill #0

## 2023-02-28 MED ORDER — PREDNISONE 20 MG PO TABS
40.0000 mg | ORAL_TABLET | Freq: Every day | ORAL | 0 refills | Status: AC
  Filled 2023-02-28: qty 10, 5d supply, fill #0

## 2023-02-28 MED ORDER — AZITHROMYCIN 500 MG PO TABS
500.0000 mg | ORAL_TABLET | Freq: Every day | ORAL | 0 refills | Status: AC
  Filled 2023-02-28: qty 3, 3d supply, fill #0

## 2023-02-28 NOTE — Assessment & Plan Note (Signed)
 Patient has a past medical history of reactive airway disease.  No PFTs on file to distinguish between asthma versus COPD.  Today patient has concerns for multiple weeks of cough, congestion, and wheezing.  She states that she has run out of her inhaler.  She also reports having a change in sputum color to green and yellow.  She reports nothing has been helping her.  She states that she has had no sick contacts and denies any fever.  She reports having nighttime awakening with cough.  Likely I think this is an exacerbation of her reactive airway disease.  Patient will benefit with PFTs to help distinguish.  Plan: -Encourage patient to refill inhalers -Start prednisone  40 mg for the next 5 days -Azithromycin  500 mg for next 3 days -Robitussin for cough -Patient referred for PFTs -Patient to follow-up in 1-2 weeks if not improved

## 2023-02-28 NOTE — Progress Notes (Signed)
   CC: Congestion and cough  This is a telephone encounter between Sharlet JONELLE Legacy and Libby Blanch on 02/28/2023 for congestion and cough. The visit was conducted with the patient located at home and Libby Blanch at [facility]. The patient's identity was confirmed using their DOB and current address. The patient has consented to being evaluated through a telephone encounter and understands the associated risks (an examination cannot be done and the patient may need to come in for an appointment) / benefits (allows the patient to remain at home, decreasing exposure to coronavirus). I personally spent 15 minutes on medical discussion.   HPI:  [name] is a 63 y.o. with PMH as below.   Please see A&P for assessment of the patient's acute and chronic medical conditions.   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  No PFTs on file to distinguish between asthma versus COPD.  Today patient has concerns for multiple weeks of cough, congestion, and wheezing.  She states that she has run out of her inhaler.  She also reports  having a change in sputum color to green and yellow.  She reports nothing has been helping her.  She states that she has had no sick contacts and denies any fever.  She reports having nighttime awakening with cough.  Likely I think this is an exacerbation of her reactive airway disease.  Patient will benefit with PFTs to help distinguish.  Plan: -Encourage patient to refill inhalers -Start prednisone  40 mg for the next 5 days -Azithromycin  500 mg for next 3 days -Robitussin for cough -Patient referred for PFTs -Patient to follow-up in 1-2 weeks if not improved   Patient discussed with Dr. Karna Libby Blanch, DO  Internal Medicine Resident

## 2023-03-01 ENCOUNTER — Other Ambulatory Visit (HOSPITAL_COMMUNITY): Payer: Self-pay

## 2023-03-01 NOTE — Progress Notes (Signed)
Internal Medicine Clinic Attending  Case discussed with Dr. Patel  At the time of the visit.  We reviewed the resident's history and exam and pertinent patient test results.  I agree with the assessment, diagnosis, and plan of care documented in the resident's note.  

## 2023-03-09 ENCOUNTER — Other Ambulatory Visit: Payer: Self-pay

## 2023-03-09 ENCOUNTER — Other Ambulatory Visit (HOSPITAL_COMMUNITY): Payer: Self-pay

## 2023-03-09 MED ORDER — BUDESONIDE-FORMOTEROL FUMARATE 80-4.5 MCG/ACT IN AERO
2.0000 | INHALATION_SPRAY | Freq: Every day | RESPIRATORY_TRACT | 12 refills | Status: DC
  Filled 2023-03-09 (×2): qty 10.2, 30d supply, fill #0

## 2023-03-11 ENCOUNTER — Encounter: Payer: Self-pay | Admitting: Orthopaedic Surgery

## 2023-03-11 ENCOUNTER — Ambulatory Visit: Payer: Medicaid Other | Admitting: Orthopaedic Surgery

## 2023-03-11 DIAGNOSIS — M542 Cervicalgia: Secondary | ICD-10-CM

## 2023-03-11 DIAGNOSIS — M5412 Radiculopathy, cervical region: Secondary | ICD-10-CM

## 2023-03-11 MED ORDER — HYDROCODONE-ACETAMINOPHEN 5-325 MG PO TABS: 1.0000 | ORAL_TABLET | Freq: Every day | ORAL | 0 refills | Status: AC | PRN

## 2023-03-11 MED ORDER — METHOCARBAMOL 750 MG PO TABS: 750.0000 mg | ORAL_TABLET | Freq: Three times a day (TID) | ORAL | 1 refills | Status: AC | PRN

## 2023-03-11 MED ORDER — PREDNISONE 10 MG (21) PO TBPK: ORAL_TABLET | ORAL | 2 refills | Status: DC

## 2023-03-11 NOTE — Progress Notes (Unsigned)
Office Visit Note   Patient: [name]           Date of Birth: Mar 13, 1960           MRN: [id number] Visit Date: 03/11/2023              Requested by: Masters, [place], DO [address] [place],  [place] [address] PCP: Rudene Christians, DO   Assessment & Plan: Visit Diagnoses:  1. Neck pain   2. 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  We have discussed starting her on a steroid pack muscle relaxer and pain medicine as well as physical therapy.  She is agreeable to this plan.  If she is still having shoulder pain and limited range of motion once her neck improves, she will follow-up with Dr. Shon Baton for intra-articular cortisone injection to the left shoulder.  Otherwise, she will follow-up with [place] as needed.  Follow-Up Instructions: No follow-ups on file.   Orders:  No orders of the defined types were placed in this encounter.  No orders of the defined types were placed in this encounter.     Procedures: No procedures performed   Clinical Data: No additional findings.   Subjective: Chief Complaint  Patient presents with   Left Shoulder - Pain    HPI patient is a pleasant 63 year old female who comes in today with continued left upper extremity pain.  She states that the pain starts to the lateral neck and radiates down her entire arm.  Pain is constant worse when she is sleeping as well as with any movements of the neck.  She has been using heat and taking Tylenol and NSAIDs without relief.  She is also complaining of numbness, tingling and burning.  She underwent subacromial cortisone injection back in November of this past year without any relief.  She has not been to physical therapy.  She did undergo recent x-rays of the cervical spine which showed moderate multilevel changes worse C5-6 and C6-7 with straightening of the cervical  spine.  Review of Systems as detailed in HPI.  All others reviewed and are negative.   Objective: Vital Signs: LMP 04/24/2015 (Approximate)   Physical Exam well-developed and well-nourished female no acute distress.  Alert and oriented x 3.  Ortho Exam cervical spine exam: Spinous and paraspinous tenderness on the left side.  Tenderness to the parascapular region as well.  Increased pain with cervical spine flexion, extension and rotation.  Left shoulder exam: Asymmetric range of motion compared to the left side.  Forward flexion to approximately 140 degrees.  External rotation to 25 degrees.  No focal weakness.  She is neurovascularly intact distally.  Specialty Comments:  No specialty comments available.  Imaging: No results found.   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 (per dr Nelly Rout note)   Asthma  seasonal and environmental   History of cancer of vulva oncologist-  dr clark-pearson/  dr Nelly Rout   2002  -- carcinoma in situ  s/p  vulvectomy/   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 (negative margins & nodes)   History of cellulitis    post op 2004 vulvectomy surgery --  left thigh celluitis-- resolved   History of cervical cancer    2002  carcinoma in situ  s/p  conization   History of peptic ulcer    age 89's   due to drinking alcohol --  resolved when quit   Pelvic cramping     PMB (postmenopausal bleeding)     Family History  Problem Relation Age of Onset   Hypertension Mother    Heart attack Father    Breast cancer Maternal Aunt    Cancer Maternal Aunt    Diabetes Maternal Grandmother     Past Surgical History:  Procedure Laterality Date   DILATION AND CURETTAGE OF UTERUS N/A 09/25/2015   Procedure: DILATATION AND CURETTAGE;  Surgeon: Laurette Schimke, MD;  Location: [facility] [place];  Service: Gynecology;  Laterality: N/A;   EUA/  COLD KNIFE CERVICAL CONIZATION/  ENDOCERVICAL CURETTAGE/  PARTIAL LEFT VULVECTOMY  04/10/2000   LEFT INGUINAL LYMPHADENECTOMY/  LEFT MODIFIED RADICAL VULVECTOMY/  RIGHT SIMPLE VULECTOMY/  EXCISION PERIANAL LESIONS  06/26/2002   Social History   Occupational History   Not on file  Tobacco Use   Smoking status: Every Day    Current packs/day: 0.50    Average packs/day: 0.5 packs/day for 32.0 years (16.0 ttl pk-yrs)    Types: Cigarettes   Smokeless tobacco: Never   Tobacco comments:    pt is working on [employer] since 2016  Vaping Use   Vaping status: Never Used  Substance and Sexual Activity   Alcohol use: Yes    Comment: occ   Drug use: No   Sexual activity: Yes    Birth control/protection: Post-menopausal

## 2023-03-14 ENCOUNTER — Encounter: Payer: No Typology Code available for payment source | Admitting: Student

## 2023-03-14 NOTE — Progress Notes (Deleted)
CC: ***  HPI:  [name] is a 63 y.o. female with a past medical history of reactive airway disease, tobacco use who presents for follow-up appointment.  Please see assessment and plan for full HPI.  Patient was recently evaluated via telehealth. Patient has a past medical history of reactive airway disease.  No PFTs on file to distinguish between asthma versus COPD.  Today patient has concerns for multiple weeks of cough, congestion, and wheezing.  She states that she has run out of her inhaler.  She also reports having a change in sputum color to green and yellow.  She reports nothing has been helping her.  She states that she has had no sick contacts and denies any fever.  She reports having nighttime awakening with cough.  Likely I think this is an exacerbation of her reactive airway disease.  Patient will benefit with PFTs to help distinguish.   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 (negative margins & nodes)   History of cellulitis    post op 2004 vulvectomy surgery --  left thigh celluitis-- resolved   History of cervical cancer    2002  carcinoma in situ  s/p  conization   History of peptic ulcer    age 74's   due to drinking alcohol --  resolved when quit   Pelvic cramping    PMB (postmenopausal bleeding)      Current Outpatient Medications:    albuterol (VENTOLIN HFA) 108 (90 Base) MCG/ACT inhaler, Inhale  1-2 puffs into the lungs every 6 (six) hours as needed for wheezing or shortness of breath., Disp: 6.7 g, Rfl: 0   budesonide-formoterol (SYMBICORT) 80-4.5 MCG/ACT inhaler, Inhale 2 puffs into the lungs daily., Disp: 10.2 g, Rfl: 12   guaiFENesin (ROBITUSSIN) 100 MG/5ML liquid, Take 5 mLs by mouth every 4 (four) hours as needed for cough or to loosen phlegm., Disp: 118 mL, Rfl: 0   HYDROcodone-acetaminophen (NORCO) 5-325 MG tablet, Take 1-2 tablets by mouth daily as needed., Disp: 10 tablet, Rfl: 0   HYDROcodone-acetaminophen (NORCO/VICODIN) 5-325 MG tablet, Take 1 tablet by mouth every 6 (six) hours as needed for moderate pain (pain score 4-6) or severe pain (pain score 7-10)., Disp: 8 tablet, Rfl: 0   methocarbamol (ROBAXIN-750) 750 MG tablet, Take 1 tablet (750 mg total) by mouth 3 (three) times daily as needed for muscle spasms., Disp: 30 tablet, Rfl: 1   naproxen (NAPROSYN) 375 MG tablet, Take 1 tablet (375 mg total) by mouth 2 (two) times daily with a meal., Disp: 14 tablet, Rfl: 0   predniSONE (DELTASONE) 20 MG tablet, Take 2 tablets (40 mg total) by mouth daily with breakfast for 5 days., Disp: 10 tablet, Rfl: 0   predniSONE (STERAPRED UNI-PAK 21 TAB) 10 MG (21) TBPK tablet, Do not take in addition to any other steroid prescription.  Take as  directed once you start taking this medication, Disp: 21 tablet, Rfl: 2  Review of Systems:  ***  Constitutional: Eye: Respiratory: Cardiovascular: GI: MSK: GU: Skin: Neuro: Endocrine:   Physical Exam:  There were no vitals filed for this visit. *** General: Patient is sitting comfortably in the room  Eyes: Pupils equal and reactive to light, EOM intact  Head: Normocephalic, atraumatic  Neck: Supple, nontender, full range of motion, No JVD Cardio: Regular rate and rhythm, no murmurs, rubs or gallops. 2+ pulses to bilateral upper and lower extremities  Chest: No chest tenderness Pulmonary: Clear to ausculation bilaterally with no rales,  rhonchi, and crackles  Abdomen: Soft, nontender with normoactive bowel sounds with no rebound or guarding  Neuro: Alert and orientated x3. CN II-XII intact. Sensation intact to upper and lower extremities. 2+ patellar reflex.  Back: No midline tenderness, no step off or deformities noted. No paraspinal muscle tenderness.  Skin: No rashes noted  MSK: 5/5 strength to upper and lower extremities.    Assessment & Plan:   No problem-specific Assessment & Plan notes found for this encounter.    Patient {GC/GE:3044014::"discussed with","seen with"} Dr. {NAMES:[id number]::"Guilloud","Hoffman","Mullen","Narendra","Williams","Vincent"}  Modena Slater, DO PGY-2 Internal Medicine Resident  Pager: [phone]

## 2023-03-19 DIAGNOSIS — Z419 Encounter for procedure for purposes other than remedying health state, unspecified: Secondary | ICD-10-CM | POA: Diagnosis not present

## 2023-03-29 ENCOUNTER — Encounter: Payer: Self-pay | Admitting: *Deleted

## 2023-04-16 DIAGNOSIS — Z419 Encounter for procedure for purposes other than remedying health state, unspecified: Secondary | ICD-10-CM | POA: Diagnosis not present

## 2023-05-23 ENCOUNTER — Other Ambulatory Visit: Payer: Self-pay | Admitting: *Deleted

## 2023-05-23 ENCOUNTER — Telehealth: Payer: Self-pay

## 2023-05-23 MED ORDER — ALBUTEROL SULFATE HFA 108 (90 BASE) MCG/ACT IN AERS: 1.0000 | INHALATION_SPRAY | Freq: Four times a day (QID) | RESPIRATORY_TRACT | 3 refills | Status: DC | PRN

## 2023-05-23 MED ORDER — BUDESONIDE-FORMOTEROL FUMARATE 80-4.5 MCG/ACT IN AERO: 2.0000 | INHALATION_SPRAY | Freq: Every day | RESPIRATORY_TRACT | 3 refills | Status: DC

## 2023-05-23 NOTE — Telephone Encounter (Signed)
 Prior Authorization for patient [name] 80-4.5MCG/ACT aerosol) came through on cover my meds was submitted with last office notes awaiting approval or denial.  VWU:[id number]

## 2023-05-24 NOTE — Telephone Encounter (Signed)
 YOU ASKED FOR: Service Description Code 1 Code 2 Plan Requested  Dates Requested  Amount [facility] Aerosol [id number] Medicaid 05/23/2023 10.3 units WE DENIED: Service Description Code 1 Code 2 Plan Denied Dates Denied  Amount [employer] Aerosol [id number] Medicaid 05/23/2023 10.3 units COMMENTS:  Per the health plan's preferred drug list, at least 2 preferred drugs must be tried before requesting this drug  or tell us why the member cannot try any preferred alternatives. Please send [place] supporting chart notes and  lab results. Here is list of preferred alternatives: Advair Diskus, Advair HFA Inhaler, Dulera Inhaler, Symbicort  Inhaler.

## 2023-05-28 DIAGNOSIS — Z419 Encounter for procedure for purposes other than remedying health state, unspecified: Secondary | ICD-10-CM | POA: Diagnosis not present

## 2023-06-27 DIAGNOSIS — Z419 Encounter for procedure for purposes other than remedying health state, unspecified: Secondary | ICD-10-CM | POA: Diagnosis not present

## 2023-07-28 DIAGNOSIS — Z419 Encounter for procedure for purposes other than remedying health state, unspecified: Secondary | ICD-10-CM | POA: Diagnosis not present

## 2023-08-05 ENCOUNTER — Encounter: Payer: Self-pay | Admitting: *Deleted

## 2023-08-27 DIAGNOSIS — Z419 Encounter for procedure for purposes other than remedying health state, unspecified: Secondary | ICD-10-CM | POA: Diagnosis not present

## 2023-09-27 DIAGNOSIS — Z419 Encounter for procedure for purposes other than remedying health state, unspecified: Secondary | ICD-10-CM | POA: Diagnosis not present

## 2023-11-08 ENCOUNTER — Ambulatory Visit: Payer: Self-pay | Admitting: Internal Medicine

## 2023-11-10 ENCOUNTER — Ambulatory Visit (INDEPENDENT_AMBULATORY_CARE_PROVIDER_SITE_OTHER): Payer: Self-pay | Admitting: Student

## 2023-11-10 ENCOUNTER — Other Ambulatory Visit (HOSPITAL_COMMUNITY): Payer: Self-pay

## 2023-11-10 ENCOUNTER — Encounter: Payer: Self-pay | Admitting: Student

## 2023-11-10 ENCOUNTER — Ambulatory Visit: Payer: Self-pay | Admitting: Student

## 2023-11-10 ENCOUNTER — Ambulatory Visit
Admission: RE | Admit: 2023-11-10 | Discharge: 2023-11-10 | Disposition: A | Discharge status: Home / Self Care | Source: Ambulatory Visit | Attending: Family Medicine | Admitting: Family Medicine

## 2023-11-10 VITALS — BP 128/77 | HR 83 | Temp 98.0°F | Wt 133.8 lb

## 2023-11-10 DIAGNOSIS — R051 Acute cough: Secondary | ICD-10-CM | POA: Diagnosis present

## 2023-11-10 DIAGNOSIS — J45901 Unspecified asthma with (acute) exacerbation: Secondary | ICD-10-CM | POA: Diagnosis not present

## 2023-11-10 MED ORDER — ALBUTEROL SULFATE HFA 108 (90 BASE) MCG/ACT IN AERS: 1.0000 | INHALATION_SPRAY | Freq: Four times a day (QID) | RESPIRATORY_TRACT | 3 refills | Status: AC | PRN

## 2023-11-10 MED ORDER — BUDESONIDE-FORMOTEROL FUMARATE 80-4.5 MCG/ACT IN AERO: 2.0000 | INHALATION_SPRAY | Freq: Every day | RESPIRATORY_TRACT | 3 refills | Status: AC

## 2023-11-10 MED ORDER — AZITHROMYCIN 500 MG PO TABS: 500.0000 mg | ORAL_TABLET | Freq: Every day | ORAL | 0 refills | Status: AC

## 2023-11-10 MED ORDER — PREDNISONE 20 MG PO TABS: 40.0000 mg | ORAL_TABLET | Freq: Every day | ORAL | 0 refills | Status: AC

## 2023-11-10 MED ORDER — IPRATROPIUM-ALBUTEROL 0.5-2.5 (3) MG/3ML IN SOLN
3.0000 mL | Freq: Once | RESPIRATORY_TRACT | Status: AC
  Administered 2023-11-10: 3 mL via RESPIRATORY_TRACT

## 2023-11-10 MED ORDER — GUAIFENESIN ER 600 MG PO TB12: 600.0000 mg | ORAL_TABLET | Freq: Two times a day (BID) | ORAL | 0 refills | Status: AC

## 2023-11-10 NOTE — Assessment & Plan Note (Addendum)
 See above for treatment plan. Has not done PFTs, will f/u at next visit.

## 2023-11-10 NOTE — Patient Instructions (Signed)
 Thank you, [name] for allowing us  to provide your care today. Today we discussed:  -I am sorry to hear you are not feeling well. Hope breathing treatment helps today.  -Start prednisone  40 mg for 5 days -Start azithromycin  daily for 3 days  -Start Mucinex  twice a day for 1-2 weeks -Refilled your inhaler -Will check on getting new nebulizer machine  -Ordered chest xray  -Check for COVID, flu and RSV -Blood work today   -If symptoms worsen and have worsening trouble breathing, you will need to go to the emergency room.   I have ordered the following medication/changed the following medications:  Start the following medications: Meds ordered this encounter  Medications   predniSONE  (DELTASONE ) 20 MG tablet    Sig: Take 2 tablets (40 mg total) by mouth daily with breakfast for 5 days.    Dispense:  10 tablet    Refill:  0   azithromycin  (ZITHROMAX ) 500 MG tablet    Sig: Take 1 tablet (500 mg total) by mouth daily for 3 days. Take 1 tablet daily for 3 days.    Dispense:  3 tablet    Refill:  0   guaiFENesin  (MUCINEX ) 600 MG 12 hr tablet    Sig: Take 1 tablet (600 mg total) by mouth 2 (two) times daily for 15 days.    Dispense:  30 tablet    Refill:  0     Follow up: 3-4 weeks if symptoms do not improve   Should you have any questions or concerns please call the internal medicine clinic at [phone].    Otis Portal, D.O. [facility]

## 2023-11-10 NOTE — Progress Notes (Signed)
 CC: Acute visit  HPI: [name] is a 63 y.o. female living with a history stated below and presents today for acute visit. Please see problem based assessment and plan for additional details.  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 10 tablet 0   HYDROcodone -acetaminophen  (NORCO/VICODIN) 5-325 MG tablet Take 1 tablet by mouth every 6 (six) hours as needed for moderate pain (pain score 4-6) or severe pain (pain score 7-10). 8 tablet 0   methocarbamol  (ROBAXIN -750) 750 MG tablet Take 1 tablet (750 mg total) by mouth 3 (three) times daily as needed for muscle spasms. 30 tablet 1   No current facility-administered medications on file prior to visit.    Family History  Problem Relation Age of Onset   Hypertension Mother    Heart attack Father    Breast cancer Maternal Aunt    Cancer Maternal Aunt    Diabetes Maternal Grandmother     Social History   Socioeconomic History   Marital status: Single    Spouse name: Not on file   Number of children: 0   Years of education: Not on file   Highest education level: High school  graduate  Occupational History   Not on file  Tobacco Use   Smoking status: Every Day    Current packs/day: 0.50    Average packs/day: 0.5 packs/day for 32.0 years (16.0 ttl pk-yrs)    Types: Cigarettes   Smokeless tobacco: Never   Tobacco comments:    pt is working on [employer] since 2016  Vaping Use   Vaping status: Never Used  Substance and Sexual Activity   Alcohol use: Yes    Comment: occ   Drug use: No   Sexual activity: Yes    Birth control/protection: Post-menopausal  Other Topics Concern   Not on file  Social History Narrative   Works as a Copywriter, advertising   Social Drivers of Corporate investment banker Strain: Not on file  Food Insecurity: No Food Insecurity (06/04/2021)   Hunger Vital Sign    Worried About Running Out of Food in the Last Year: Never true    Ran Out of Food in the Last Year: Never true  Transportation Needs: No Transportation Needs (06/04/2021)   PRAPARE - Administrator, Civil Service (Medical): No    Lack of Transportation (Non-Medical): No  Physical Activity: Not on file  Stress: Not on file  Social Connections: Not on file  Intimate  Partner Violence: Not on file    Review of Systems: ROS negative except for what is noted on the assessment and plan.  Vitals:   11/10/23 0830  BP: 128/77  Pulse: 83  Temp: 98 F (36.7 C)  TempSrc: Oral  SpO2: 97%  Weight: 133 lb 12.8 oz (60.7 kg)   Physical Exam: Constitutional: Alert, ill-appearing female sitting up in chair with productive cough Cardiovascular: regular rate and rhythm Pulmonary/Chest: normal work of breathing on room air, diffuse expiratory wheezing bilaterally, no crackles, able to speak in short sentences due to coughing Neurological: alert & oriented x 3  Assessment & Plan:   Assessment & Plan Acute cough Presents with 2 weeks of productive cough with green sputum production.  Associated with congestion, shortness of breath, nausea, headache and subjective  fever and malaise.  Denies vomiting, diarrhea, abdominal pain, chest pain, sore throat.  Denies any recent sick contacts.  States symptoms are unchanged compared to 2 weeks initially.  Did report that she ran out of her maintenance inhaler for at least 1 month and unable to use nebulizer because it was broken.  Exam with diffuse wheezing, productive cough.  Able to speak in short sentences.  Saturating well on room air at 97%.  Afebrile.  Suspect viral respiratory illness with reactive airway disease exacerbation but some concern for possible pneumonia.  Plan -Check swab for COVID, flu, RSV -Start prednisone  40 mg daily X 5 days -Start azithromycin  500 mg daily X 3 days -Mucinex  600 mg twice daily -CXR and CBC today -Refilled Symbicort  and albuterol  inhalers -Return precautions provided and discussed Reactive airway disease with acute exacerbation, unspecified asthma severity, unspecified whether persistent See above for treatment plan. Has not done PFTs, will f/u at next visit.    Return in about 4 weeks (around 12/08/2023) for check up on symptoms.   Patient discussed with Dr. Karna Ozell Nearing, D.O. [facility], PGY-3 Phone: [phone] Date 11/10/2023 Time 4:09 PM

## 2023-11-11 LAB — CBC WITH DIFFERENTIAL/PLATELET
Basophils Absolute: 0 x10E3/uL (ref 0.0–0.2)
Basos: 0 %
EOS (ABSOLUTE): 0.1 x10E3/uL (ref 0.0–0.4)
Eos: 1 %
Hematocrit: 45.7 % (ref 34.0–46.6)
Hemoglobin: 14.8 g/dL (ref 11.1–15.9)
Immature Grans (Abs): 0 x10E3/uL (ref 0.0–0.1)
Immature Granulocytes: 0 %
Lymphocytes Absolute: 2.7 x10E3/uL (ref 0.7–3.1)
Lymphs: 23 %
MCH: 31.6 pg (ref 26.6–33.0)
MCHC: 32.4 g/dL (ref 31.5–35.7)
MCV: 98 fL — ABNORMAL HIGH (ref 79–97)
Monocytes Absolute: 1.1 x10E3/uL — ABNORMAL HIGH (ref 0.1–0.9)
Monocytes: 9 %
Neutrophils Absolute: 7.9 x10E3/uL — ABNORMAL HIGH (ref 1.4–7.0)
Neutrophils: 67 %
Platelets: 420 x10E3/uL (ref 150–450)
RBC: 4.68 x10E6/uL (ref 3.77–5.28)
RDW: 12.5 % (ref 11.7–15.4)
WBC: 11.8 x10E3/uL — ABNORMAL HIGH (ref 3.4–10.8)

## 2023-11-11 NOTE — Progress Notes (Signed)
 Internal Medicine Clinic Attending  Case discussed with the resident at the time of the visit.  We reviewed the resident's history and exam and pertinent patient test results.  I agree with the assessment, diagnosis, and plan of care documented in the resident's note.

## 2023-11-15 LAB — COVID-19, FLU A+B AND RSV
Influenza A, NAA: NOT DETECTED
Influenza B, NAA: NOT DETECTED
RSV, NAA: NOT DETECTED
SARS-CoV-2, NAA: NOT DETECTED

## 2023-12-15 ENCOUNTER — Ambulatory Visit: Admitting: Orthopaedic Surgery

## 2023-12-19 ENCOUNTER — Encounter: Payer: Self-pay | Admitting: Radiology

## 2023-12-30 ENCOUNTER — Ambulatory Visit: Admitting: Orthopaedic Surgery

## 2024-01-17 ENCOUNTER — Ambulatory Visit: Admitting: Orthopaedic Surgery
